# Patient Record
Sex: Female | Born: 1965 | Race: Black or African American | Hispanic: No | Marital: Single | Smoking: Never smoker
Health system: Southern US, Community
[De-identification: ages and names within clinical notes are randomized; demographics above are authoritative.]

## PROBLEM LIST (undated history)

## (undated) DIAGNOSIS — M1990 Unspecified osteoarthritis, unspecified site: Secondary | ICD-10-CM

## (undated) DIAGNOSIS — G4730 Sleep apnea, unspecified: Secondary | ICD-10-CM

## (undated) DIAGNOSIS — I10 Essential (primary) hypertension: Secondary | ICD-10-CM

## (undated) DIAGNOSIS — F419 Anxiety disorder, unspecified: Secondary | ICD-10-CM

## (undated) DIAGNOSIS — F32A Depression, unspecified: Secondary | ICD-10-CM

## (undated) DIAGNOSIS — K219 Gastro-esophageal reflux disease without esophagitis: Secondary | ICD-10-CM

## (undated) DIAGNOSIS — J40 Bronchitis, not specified as acute or chronic: Secondary | ICD-10-CM

## (undated) DIAGNOSIS — F329 Major depressive disorder, single episode, unspecified: Secondary | ICD-10-CM

## (undated) DIAGNOSIS — M47819 Spondylosis without myelopathy or radiculopathy, site unspecified: Secondary | ICD-10-CM

## (undated) DIAGNOSIS — J020 Streptococcal pharyngitis: Secondary | ICD-10-CM

## (undated) DIAGNOSIS — D649 Anemia, unspecified: Secondary | ICD-10-CM

## (undated) DIAGNOSIS — E785 Hyperlipidemia, unspecified: Secondary | ICD-10-CM

## (undated) HISTORY — PX: WISDOM TOOTH EXTRACTION: SHX21

## (undated) HISTORY — DX: Morbid (severe) obesity due to excess calories: E66.01

## (undated) HISTORY — DX: Hyperlipidemia, unspecified: E78.5

## (undated) HISTORY — DX: Sleep apnea, unspecified: G47.30

## (undated) HISTORY — PX: OTHER SURGICAL HISTORY: SHX169

---

## 1987-08-29 HISTORY — PX: TUBAL LIGATION: SHX77

## 1995-08-29 HISTORY — PX: BREAST SURGERY: SHX581

## 1995-08-29 HISTORY — PX: BREAST EXCISIONAL BIOPSY: SUR124

## 2001-08-16 ENCOUNTER — Inpatient Hospital Stay (HOSPITAL_COMMUNITY): Admission: AD | Admit: 2001-08-16 | Discharge: 2001-08-16 | Payer: Self-pay | Admitting: Obstetrics & Gynecology

## 2002-03-07 ENCOUNTER — Emergency Department (HOSPITAL_COMMUNITY): Admission: EM | Admit: 2002-03-07 | Discharge: 2002-03-07 | Payer: Self-pay | Admitting: Emergency Medicine

## 2002-03-07 ENCOUNTER — Encounter: Payer: Self-pay | Admitting: Emergency Medicine

## 2003-11-07 ENCOUNTER — Emergency Department (HOSPITAL_COMMUNITY): Admission: EM | Admit: 2003-11-07 | Discharge: 2003-11-07 | Payer: Self-pay | Admitting: Emergency Medicine

## 2003-11-09 ENCOUNTER — Emergency Department (HOSPITAL_COMMUNITY): Admission: EM | Admit: 2003-11-09 | Discharge: 2003-11-09 | Payer: Self-pay | Admitting: Family Medicine

## 2005-02-14 ENCOUNTER — Emergency Department (HOSPITAL_COMMUNITY): Admission: EM | Admit: 2005-02-14 | Discharge: 2005-02-14 | Payer: Self-pay | Admitting: Emergency Medicine

## 2006-12-10 ENCOUNTER — Ambulatory Visit: Payer: Self-pay | Admitting: Internal Medicine

## 2006-12-17 ENCOUNTER — Ambulatory Visit: Payer: Self-pay | Admitting: Internal Medicine

## 2007-01-07 ENCOUNTER — Ambulatory Visit: Payer: Self-pay | Admitting: Internal Medicine

## 2007-02-04 ENCOUNTER — Ambulatory Visit: Payer: Self-pay | Admitting: Internal Medicine

## 2007-07-18 ENCOUNTER — Ambulatory Visit: Payer: Self-pay | Admitting: Internal Medicine

## 2007-10-03 ENCOUNTER — Ambulatory Visit: Payer: Self-pay | Admitting: Internal Medicine

## 2007-10-03 ENCOUNTER — Encounter: Payer: Self-pay | Admitting: Internal Medicine

## 2007-10-03 LAB — CONVERTED CEMR LAB: GC Probe Amp, Genital: NEGATIVE

## 2007-11-28 ENCOUNTER — Ambulatory Visit: Payer: Self-pay | Admitting: Internal Medicine

## 2008-01-01 ENCOUNTER — Ambulatory Visit: Payer: Self-pay | Admitting: Internal Medicine

## 2008-02-11 ENCOUNTER — Emergency Department (HOSPITAL_COMMUNITY): Admission: EM | Admit: 2008-02-11 | Discharge: 2008-02-11 | Payer: Self-pay | Admitting: Emergency Medicine

## 2008-03-11 ENCOUNTER — Ambulatory Visit: Payer: Self-pay | Admitting: Internal Medicine

## 2008-03-11 LAB — CONVERTED CEMR LAB
CO2: 25 meq/L (ref 19–32)
Chloride: 99 meq/L (ref 96–112)
Potassium: 4 meq/L (ref 3.5–5.3)
Sodium: 138 meq/L (ref 135–145)

## 2009-01-12 ENCOUNTER — Ambulatory Visit: Payer: Self-pay | Admitting: Internal Medicine

## 2009-05-31 ENCOUNTER — Telehealth (INDEPENDENT_AMBULATORY_CARE_PROVIDER_SITE_OTHER): Payer: Self-pay | Admitting: *Deleted

## 2009-06-02 ENCOUNTER — Ambulatory Visit: Payer: Self-pay | Admitting: Internal Medicine

## 2009-08-16 ENCOUNTER — Ambulatory Visit: Payer: Self-pay | Admitting: Internal Medicine

## 2009-09-02 ENCOUNTER — Telehealth (INDEPENDENT_AMBULATORY_CARE_PROVIDER_SITE_OTHER): Payer: Self-pay | Admitting: *Deleted

## 2009-09-13 ENCOUNTER — Ambulatory Visit: Payer: Self-pay | Admitting: Internal Medicine

## 2010-03-11 ENCOUNTER — Ambulatory Visit: Payer: Self-pay | Admitting: Internal Medicine

## 2010-03-11 ENCOUNTER — Ambulatory Visit (HOSPITAL_COMMUNITY): Admission: RE | Admit: 2010-03-11 | Discharge: 2010-03-11 | Payer: Self-pay | Admitting: Internal Medicine

## 2010-03-11 LAB — CONVERTED CEMR LAB
BUN: 12 mg/dL (ref 6–23)
Chloride: 100 meq/L (ref 96–112)
Glucose, Bld: 91 mg/dL (ref 70–99)
Potassium: 3.9 meq/L (ref 3.5–5.3)
Sodium: 137 meq/L (ref 135–145)

## 2010-03-24 ENCOUNTER — Emergency Department (HOSPITAL_COMMUNITY): Admission: EM | Admit: 2010-03-24 | Discharge: 2010-03-24 | Payer: Self-pay | Admitting: Family Medicine

## 2010-03-28 ENCOUNTER — Encounter: Admission: RE | Admit: 2010-03-28 | Discharge: 2010-03-28 | Payer: Self-pay | Admitting: Internal Medicine

## 2010-04-01 ENCOUNTER — Ambulatory Visit: Payer: Self-pay | Admitting: Internal Medicine

## 2010-04-07 ENCOUNTER — Ambulatory Visit: Payer: Self-pay | Admitting: Internal Medicine

## 2010-08-01 ENCOUNTER — Encounter (INDEPENDENT_AMBULATORY_CARE_PROVIDER_SITE_OTHER): Payer: Self-pay | Admitting: Internal Medicine

## 2010-08-01 LAB — CONVERTED CEMR LAB
BUN: 10 mg/dL (ref 6–23)
Chloride: 99 meq/L (ref 96–112)
Glucose, Bld: 105 mg/dL — ABNORMAL HIGH (ref 70–99)
Potassium: 3.4 meq/L — ABNORMAL LOW (ref 3.5–5.3)
Sodium: 139 meq/L (ref 135–145)

## 2010-09-29 NOTE — Progress Notes (Signed)
Summary: triage/possible medication reaction  Phone Note Call from Patient   Caller: Patient Reason for Call: Talk to Nurse Summary of Call: patient states she is having tremors and dizziness with new medication "Mirtazapine"  (Remeron).  She was see by Dr. Reche Dixon and he discontinued Celexa (which is noted in chart on 12/20.Marland KitchenMarland KitchenShe states he also disconntinued Clonazapam 0.5mg .Marland KitchenMarland KitchenPatient started on Mirtazapine 15 mg 1 by mouth at hs for 7 days...now she is suppose to increase dose to 2 tablets at hs.Marland KitchenMarland KitchenPatients pharmacy called and she last picked up Clonazapam 12/15 and has 3 refills left.Marland KitchenMarland KitchenSpoke with Dr. Audria Nine and orders given.. Initial call taken by: Conchita Paris,  September 02, 2009 9:44 AM

## 2010-11-28 ENCOUNTER — Inpatient Hospital Stay (INDEPENDENT_AMBULATORY_CARE_PROVIDER_SITE_OTHER)
Admission: RE | Admit: 2010-11-28 | Discharge: 2010-11-28 | Disposition: A | Discharge status: Home / Self Care | Payer: Self-pay | Source: Ambulatory Visit | Attending: Family Medicine | Admitting: Family Medicine

## 2010-11-28 DIAGNOSIS — J020 Streptococcal pharyngitis: Secondary | ICD-10-CM

## 2010-11-28 LAB — POCT RAPID STREP A (OFFICE): Streptococcus, Group A Screen (Direct): NEGATIVE

## 2011-03-06 ENCOUNTER — Ambulatory Visit (HOSPITAL_BASED_OUTPATIENT_CLINIC_OR_DEPARTMENT_OTHER): Payer: Self-pay | Attending: Internal Medicine

## 2011-03-06 DIAGNOSIS — G4733 Obstructive sleep apnea (adult) (pediatric): Secondary | ICD-10-CM | POA: Insufficient documentation

## 2011-03-11 DIAGNOSIS — G4733 Obstructive sleep apnea (adult) (pediatric): Secondary | ICD-10-CM

## 2011-03-11 NOTE — Procedures (Signed)
NAME:  Claire Green, Claire Green                 ACCOUNT NO.:  [id number]  MEDICAL RECORD NO.:  [id number]          PATIENT TYPE:  OUT  LOCATION:  SLEEP CENTER                 FACILITY:  [facility]  PHYSICIAN:  Clinton D. Maple Hudson, MD, FCCP, FACPDATE OF BIRTH:  March 23, 1966  DATE OF STUDY:  03/06/2011                           NOCTURNAL POLYSOMNOGRAM  REFERRING PHYSICIAN:  Julieanne Manson  REFERRING PHYSICIAN:  Marcene Duos, MD  INDICATIONS FOR STUDY:  Hypersomnia with sleep apnea.  EPWORTH SLEEPINESS SCORE:  9/24, BMI 42.  Weight 250 pounds, height 65 inches, neck 15 inches.  Home medications are charted and reviewed.  SLEEP ARCHITECTURE:  Total sleep time 298.5 minutes with sleep efficiency 82.3%.  Stage I was 9%, stage II 83.4%, stage III 0.2%, REM 7.4% of total sleep time.  Sleep latency 38 minutes, REM latency 177.5 minutes, awake after sleep onset 27.5 minutes, arousal index 9.2.  BEDTIME MEDICATION:  None.  RESPIRATORY DATA:  Apnea/hypopnea index (AHI) 27.3 per hour.  A total of 136 events was scored including 38 obstructive apneas, 12 central apneas, 1 mixed apnea, 85 hypopneas.  Events were seen in all sleep positions, especially while supine.  REM AHI 16.4 per hour.  There were insufficient early events to meet necessary criteria for initiation of CPAP titration by split protocol on this study night.  OXYGEN DATA:  Moderately loud snoring with oxygen desaturation to a nadir of 84% and mean oxygen saturation through the study of 94.8% on room air.  CARDIAC DATA:  Normal sinus rhythm.  MOVEMENT/PARASOMNIA:  No significant movement disturbance.  No bathroom trips.  IMPRESSION/RECOMMENDATIONS: 1. Moderate obstructive sleep apnea/hypopnea syndrome, AHI 27.3 per     hour.  Nonpositional events with moderately loud snoring and oxygen     desaturation to a nadir of 84% with a mean saturation through the     study of 94.8% on room air. 2. There were insufficient early events to  meet diagnostic criteria     and time to allow initiation of CPAP titration     by split protocol on this study night.  Consider return for CPAP     titration study or evaluate for clinical management as indicated.     Clinton D. Maple Hudson, MD, [facility], FACP Diplomate, Biomedical engineer of Sleep Medicine Electronically Signed    CDY/MEDQ  D:  03/11/2011 09:25:42  T:  03/11/2011 12:15:17  Job:  [id number]

## 2011-05-02 ENCOUNTER — Ambulatory Visit (HOSPITAL_BASED_OUTPATIENT_CLINIC_OR_DEPARTMENT_OTHER): Payer: Self-pay | Attending: Family Medicine

## 2011-05-02 DIAGNOSIS — G4730 Sleep apnea, unspecified: Secondary | ICD-10-CM | POA: Insufficient documentation

## 2011-05-02 DIAGNOSIS — G4710 Hypersomnia, unspecified: Secondary | ICD-10-CM | POA: Insufficient documentation

## 2011-05-06 DIAGNOSIS — G4733 Obstructive sleep apnea (adult) (pediatric): Secondary | ICD-10-CM

## 2011-05-06 NOTE — Procedures (Signed)
NAME:  Claire Green, Claire Green                 ACCOUNT NO.:  [id number]  MEDICAL RECORD NO.:  [id number]          PATIENT TYPE:  OUT  LOCATION:  SLEEP CENTER                 [facility]ACILITY:  [facility]  PHYSICIAN:  Breydon Senters D. Maple Hudson, MD, FCCP, FACPDATE OF BIRTH:  05-21-66  DATE OF STUDY:  05/02/2011                           NOCTURNAL POLYSOMNOGRAM  REFERRING PHYSICIAN:  Georganna Skeans, MD  INDICATION FOR STUDY:  Hypersomnia with sleep apnea.  EPWORTH SLEEPINESS SCORE:  8/24.  BMI 41.6.  Weight 250 pounds, height 65 inches.  Neck 15 inches.  MEDICATIONS:  Charted and reviewed.  A baseline diagnostic and PSG on March 06, 2011 demonstrated moderate obstructive sleep apnea with an AHI of 27.3 per hour.  CPAP titration is now requested.  Bedtime Medication:  Clonazepam, Neurontin, hydroxyzine.  SLEEP ARCHITECTURE:  Total sleep time 366.5 minutes with sleep efficiency 86.2%.  Stage I was 4.4%, stage II 75.3%, stage III 1.1%, REM 19.2% of total sleep time.  Sleep latency 48.5 minutes, REM latency 143 minutes, awake after sleep onset 10 minutes, arousal index 13.6.  RESPIRATORY DATA:  CPAP titration protocol.  CPAP was titrated to 13 CWP, AHI 1.7 per hour.  She wore a small ResMed Mirage FX full-face mask with heated humidifier and a C-Flex setting of 3.  OXYGEN DATA:  Snoring was prevented and mean oxygen saturation held 97.5% on room air with CPAP.  CARDIAC DATA:  Normal sinus rhythm.  MOVEMENT-PARASOMNIA:  A few limb jerks were noted with insignificant effect on sleep.  No bathroom trips.  IMPRESSIONS-RECOMMENDATIONS: 1. Successful CPAP titration to 13 CWP, AHI 1.7 per hour.  She wore a     small ResMed Mirage FX full-face mask     with heated humidifier and C-Flex setting of 3.  Snoring was     prevented and oxygenation well maintained. 2. Baseline diagnostic NPSG on March 06, 2011, had demonstrated AHI of     27.3 per hour.     Kleber Crean D. Maple Hudson, MD, [facility], FACP Diplomate, Biomedical engineer of  Sleep Medicine Electronically Signed    CDY/MEDQ  D:  05/06/2011 10:51:19  T:  05/06/2011 11:33:07  Job:  [id number]

## 2011-08-09 ENCOUNTER — Other Ambulatory Visit (HOSPITAL_COMMUNITY): Payer: Self-pay | Admitting: Family Medicine

## 2011-08-09 DIAGNOSIS — Z1231 Encounter for screening mammogram for malignant neoplasm of breast: Secondary | ICD-10-CM

## 2011-08-13 ENCOUNTER — Emergency Department (HOSPITAL_COMMUNITY): Payer: Self-pay

## 2011-08-13 ENCOUNTER — Emergency Department (HOSPITAL_COMMUNITY)
Admission: EM | Admit: 2011-08-13 | Discharge: 2011-08-13 | Disposition: A | Discharge status: Home / Self Care | Payer: Self-pay | Attending: Emergency Medicine | Admitting: Emergency Medicine

## 2011-08-13 ENCOUNTER — Encounter: Payer: Self-pay | Admitting: *Deleted

## 2011-08-13 DIAGNOSIS — I10 Essential (primary) hypertension: Secondary | ICD-10-CM | POA: Insufficient documentation

## 2011-08-13 DIAGNOSIS — R1013 Epigastric pain: Secondary | ICD-10-CM | POA: Insufficient documentation

## 2011-08-13 DIAGNOSIS — E876 Hypokalemia: Secondary | ICD-10-CM | POA: Insufficient documentation

## 2011-08-13 DIAGNOSIS — R1011 Right upper quadrant pain: Secondary | ICD-10-CM | POA: Insufficient documentation

## 2011-08-13 DIAGNOSIS — R112 Nausea with vomiting, unspecified: Secondary | ICD-10-CM | POA: Insufficient documentation

## 2011-08-13 DIAGNOSIS — K219 Gastro-esophageal reflux disease without esophagitis: Secondary | ICD-10-CM | POA: Insufficient documentation

## 2011-08-13 HISTORY — DX: Gastro-esophageal reflux disease without esophagitis: K21.9

## 2011-08-13 HISTORY — DX: Unspecified osteoarthritis, unspecified site: M19.90

## 2011-08-13 HISTORY — DX: Essential (primary) hypertension: I10

## 2011-08-13 HISTORY — DX: Depression, unspecified: F32.A

## 2011-08-13 HISTORY — DX: Anxiety disorder, unspecified: F41.9

## 2011-08-13 HISTORY — DX: Major depressive disorder, single episode, unspecified: F32.9

## 2011-08-13 LAB — URINALYSIS, ROUTINE W REFLEX MICROSCOPIC
Glucose, UA: NEGATIVE mg/dL
Specific Gravity, Urine: 1.022 (ref 1.005–1.030)

## 2011-08-13 LAB — URINE MICROSCOPIC-ADD ON

## 2011-08-13 LAB — COMPREHENSIVE METABOLIC PANEL
ALT: 9 U/L (ref 0–35)
AST: 16 U/L (ref 0–37)
Alkaline Phosphatase: 72 U/L (ref 39–117)
CO2: 30 mEq/L (ref 19–32)
Calcium: 9.3 mg/dL (ref 8.4–10.5)
Potassium: 3.1 mEq/L — ABNORMAL LOW (ref 3.5–5.1)
Sodium: 138 mEq/L (ref 135–145)
Total Protein: 8.2 g/dL (ref 6.0–8.3)

## 2011-08-13 LAB — CBC
MCH: 21.5 pg — ABNORMAL LOW (ref 26.0–34.0)
MCHC: 30.8 g/dL (ref 30.0–36.0)
Platelets: 415 10*3/uL — ABNORMAL HIGH (ref 150–400)
RBC: 4.83 MIL/uL (ref 3.87–5.11)

## 2011-08-13 MED ORDER — MORPHINE SULFATE 4 MG/ML IJ SOLN
4.0000 mg | Freq: Once | INTRAMUSCULAR | Status: AC
  Administered 2011-08-13: 4 mg via INTRAVENOUS
  Filled 2011-08-13: qty 1

## 2011-08-13 MED ORDER — POTASSIUM CHLORIDE ER 10 MEQ PO TBCR: 20.0000 meq | EXTENDED_RELEASE_TABLET | Freq: Two times a day (BID) | ORAL | Status: DC

## 2011-08-13 MED ORDER — PROMETHAZINE HCL 25 MG PO TABS: 25.0000 mg | ORAL_TABLET | Freq: Four times a day (QID) | ORAL | Status: AC | PRN

## 2011-08-13 MED ORDER — POTASSIUM CHLORIDE CRYS ER 20 MEQ PO TBCR
40.0000 meq | EXTENDED_RELEASE_TABLET | Freq: Once | ORAL | Status: AC
  Administered 2011-08-13: 40 meq via ORAL
  Filled 2011-08-13: qty 1

## 2011-08-13 MED ORDER — SODIUM CHLORIDE 0.9 % IV SOLN
INTRAVENOUS | Status: DC
  Administered 2011-08-13: 12:00:00 via INTRAVENOUS

## 2011-08-13 MED ORDER — POTASSIUM CHLORIDE CRYS ER 20 MEQ PO TBCR
EXTENDED_RELEASE_TABLET | ORAL | Status: AC
  Filled 2011-08-13: qty 1

## 2011-08-13 MED ORDER — ONDANSETRON HCL 4 MG/2ML IJ SOLN
4.0000 mg | Freq: Once | INTRAMUSCULAR | Status: AC
  Administered 2011-08-13: 4 mg via INTRAVENOUS
  Filled 2011-08-13: qty 2

## 2011-08-13 MED ORDER — SODIUM CHLORIDE 0.9 % IV BOLUS (SEPSIS)
500.0000 mL | Freq: Once | INTRAVENOUS | Status: AC
  Administered 2011-08-13: 500 mL via INTRAVENOUS

## 2011-08-13 NOTE — ED Notes (Signed)
Pt reports having sudden onset of epigastric pain last night.  LBM was last night at 11pm-normal.  States that she was constipated and that last night was her first BM in 2 days.  Pt tender on palpation.  Pt reports vomiting x 2 this am.  States that she started to vomit after eating dinner.  Skin warm, dry and intact.  Neuro intact. Pt ambulatory in dept without difficulty.

## 2011-08-13 NOTE — ED Notes (Signed)
No active vomiting at the time. Pt resting quietly. Vital signs stable. Family remains at bedside.

## 2011-08-13 NOTE — ED Provider Notes (Signed)
History     CSN: [id number] Arrival date & time: 08/13/2011  6:40 AM   First MD Initiated Contact with Patient 08/13/11 [phone]      Chief Complaint  Patient presents with  . Abdominal Pain    BIL upper    (Consider location/radiation/quality/duration/timing/severity/associated sxs/prior treatment) Patient is a 45 y.o. female presenting with abdominal pain. The history is provided by the patient and the spouse.  Abdominal Pain The primary symptoms of the illness include abdominal pain, nausea and vomiting. The primary symptoms of the illness do not include fever, shortness of breath, diarrhea, hematemesis or dysuria. The current episode started yesterday. The onset of the illness was sudden. Progression since onset: Waxing and waning.  The abdominal pain is located in the RUQ and epigastric region. The abdominal pain does not radiate. The abdominal pain is relieved by nothing. The abdominal pain is exacerbated by vomiting.  Additional symptoms associated with the illness include chills. Symptoms associated with the illness do not include constipation, hematuria or back pain.   patient reports sudden onset of nausea last night that preceded upper abdominal pain, which was then followed by vomiting and resolution of the pain. The pain has been waxing and waning in this cycle since that time. It is epigastric and right upper quadrant in location, burning/stabbing in nature, moderate in intensity but absent at time of examination. There has been no prior treatment. Last bowel movement was yesterday and was normal for her. No sick contacts.  Past Medical History  Diagnosis Date  . Hypertension   . Arthritis   . Anxiety   . Depression   . GERD (gastroesophageal reflux disease)     History reviewed. No pertinent past surgical history.  History reviewed. No pertinent family history.  History  Substance Use Topics  . Smoking status: Not on file  . Smokeless tobacco: Not on file  . Alcohol  Use:      Review of Systems  Constitutional: Positive for chills. Negative for fever.  HENT: Negative for hearing loss, ear pain, congestion, sore throat, neck pain, neck stiffness and sinus pressure.   Eyes: Negative for pain and visual disturbance.  Respiratory: Negative for cough, chest tightness and shortness of breath.   Cardiovascular: Negative for chest pain, palpitations and leg swelling.  Gastrointestinal: Positive for nausea, vomiting and abdominal pain. Negative for diarrhea, constipation, blood in stool and hematemesis.  Genitourinary: Negative for dysuria and hematuria.       Currently on menses  Musculoskeletal: Negative for back pain, joint swelling and gait problem.  Skin: Negative for rash and wound.  Neurological: Negative for dizziness, syncope, weakness, numbness and headaches.  Psychiatric/Behavioral: Negative for behavioral problems and confusion.    Allergies  Penicillins  Home Medications   Current Outpatient Rx  Name Route Sig Dispense Refill  . CLONAZEPAM 0.5 MG PO TABS Oral Take 0.5 mg by mouth 3 (three) times daily as needed. anxiety     . GABAPENTIN 100 MG PO CAPS Oral Take 200 mg by mouth daily.      Marland Kitchen GABAPENTIN 400 MG PO CAPS Oral Take 400 mg by mouth at bedtime.      Marland Kitchen HYDROCHLOROTHIAZIDE 25 MG PO TABS Oral Take 25 mg by mouth daily.      Marland Kitchen HYDROXYZINE PAMOATE 25 MG PO CAPS Oral Take 25 mg by mouth 3 (three) times daily as needed.      Marland Kitchen NAPROXEN 375 MG PO TABS Oral Take 375 mg by mouth 2 (two) times  daily with a meal. Pain/ inflammation     . PANTOPRAZOLE SODIUM 40 MG PO TBEC Oral Take 40 mg by mouth daily.      . SERTRALINE HCL 100 MG PO TABS Oral Take 150 mg by mouth daily.        BP 131/68  Pulse 81  Temp(Src) 98.8 F (37.1 C) (Oral)  Resp 20  Ht 5\' 5"  (1.651 m)  Wt 266 lb (120.657 kg)  BMI 44.26 kg/m2  SpO2 99%  Physical Exam  Nursing note and vitals reviewed. Constitutional: She is oriented to person, place, and time. She appears  well-developed and well-nourished. No distress.       Obese  HENT:  Head: Normocephalic and atraumatic.  Right Ear: External ear normal.  Left Ear: External ear normal.  Mouth/Throat: Oropharynx is clear and moist.  Eyes: Conjunctivae are normal. Pupils are equal, round, and reactive to light. No scleral icterus.  Neck: Normal range of motion. Neck supple.  Cardiovascular: Normal rate, regular rhythm, normal heart sounds and intact distal pulses.   No murmur heard. Pulmonary/Chest: Effort normal and breath sounds normal. No respiratory distress. She exhibits no tenderness.  Abdominal: Soft. Bowel sounds are normal. She exhibits no distension and no mass. There is no rebound and no guarding.       Moderate tenderness to palpation over the right upper quadrant, mild tenderness to palpation in the epigastric region  Musculoskeletal: Normal range of motion. She exhibits no edema and no tenderness.  Lymphadenopathy:    She has no cervical adenopathy.  Neurological: She is alert and oriented to person, place, and time. Coordination normal.  Skin: Skin is warm and dry. No rash noted.  Psychiatric: Her behavior is normal.    ED Course  Procedures (including critical care time)  Labs Reviewed  CBC - Abnormal; Notable for the following:    WBC 11.8 (*)    Hemoglobin 10.4 (*)    HCT 33.8 (*)    MCV 70.0 (*)    MCH 21.5 (*)    RDW 17.5 (*)    Platelets 415 (*)    All other components within normal limits  COMPREHENSIVE METABOLIC PANEL - Abnormal; Notable for the following:    Potassium 3.1 (*)    Glucose, Bld 121 (*)    GFR calc non Af Amer 85 (*)    All other components within normal limits  URINALYSIS, ROUTINE W REFLEX MICROSCOPIC - Abnormal; Notable for the following:    APPearance CLOUDY (*)    Hgb urine dipstick LARGE (*)    Leukocytes, UA SMALL (*)    All other components within normal limits  URINE MICROSCOPIC-ADD ON - Abnormal; Notable for the following:    Squamous  Epithelial / LPF FEW (*)    All other components within normal limits  LIPASE, BLOOD   US Abdomen Complete  08/13/2011  *RADIOLOGY REPORT*  Clinical Data:  Right upper quadrant abdominal pain, nausea  COMPLETE ABDOMINAL ULTRASOUND  Comparison:  None.  Findings:  Gallbladder:  No gallstones, gallbladder wall thickening, or pericholecystic fluid.  Negative sonographic Murphy's sign  Common bile duct:  Normal in size, measuring 6 mm in diameter.  Liver:  Homogeneous hepatic echotexture.  No discrete hepatic lesions.  No ascites.  IVC:  Appears normal  Pancreas:  No focal abnormality seen.  Spleen:  Normal in size measuring 0.6 cm in length.  Right Kidney:  Normal cortical thickness, echogenicity and size, measuring 10.2 cm in length.  No focal renal  lesions.  No echogenic renal stones.  No urinary obstruction.  Left Kidney:  Normal cortical thickness, echogenicity and size, measuring 12.2 cm in length.  No focal renal lesions.  No echogenic renal stones.  No urinary obstruction.  Abdominal aorta:  No aneurysm identified.  IMPRESSION: Negative abdominal ultrasound.  Specifically, no evidence of cholelithiasis.  Original Report Authenticated By: Waynard Reeds, M.D.     Diagnosis#1 abdominal pain Diagnosis #2 hypokalemia   MDM  Abdominal pain with nausea and vomiting x1 day. Well-appearing on exam. Vital signs stable. Nausea but no vomiting since arrival to the emergency department. Abdominal ultrasound with no evidence of cholecystitis or cholelithiasis. Have discussed lab and ultrasound results with patient and she will be discharged home with nausea medication and instructions to followup with her regular doctor for a recheck or return to the emergency department if she develops worsening symptoms.  Medical screening examination/treatment/procedure(s) were performed by non-physician practitioner and as supervising physician I was immediately available for consultation/collaboration. Osvaldo Human, M.D.      [address] [place], [place] 08/13/11 1258  Carleene Cooper III, MD 08/13/11 2032

## 2011-08-13 NOTE — ED Notes (Signed)
Pt began experiencing bil upper quadrant pain.  Initially stated vomiting, but presently no emesis.  No diarrhea, fever.

## 2011-08-13 NOTE — ED Notes (Signed)
Pt w/acute onset epigastric pain this am (0330).  Denies fever or diarrhea, but c/o burning/stabbing pain accompanied by multiple episodes of emesis.  Per family, "She was shaking really bad.  It didn't look normal."  Abd non-tender to palpation.

## 2011-08-23 ENCOUNTER — Other Ambulatory Visit (HOSPITAL_COMMUNITY): Payer: Self-pay | Admitting: Internal Medicine

## 2011-08-23 ENCOUNTER — Ambulatory Visit (HOSPITAL_COMMUNITY)
Admission: RE | Admit: 2011-08-23 | Discharge: 2011-08-23 | Disposition: A | Discharge status: Home / Self Care | Payer: Self-pay | Source: Ambulatory Visit | Attending: Internal Medicine | Admitting: Internal Medicine

## 2011-08-23 DIAGNOSIS — R52 Pain, unspecified: Secondary | ICD-10-CM

## 2011-08-23 DIAGNOSIS — M25569 Pain in unspecified knee: Secondary | ICD-10-CM | POA: Insufficient documentation

## 2011-09-03 ENCOUNTER — Emergency Department (INDEPENDENT_AMBULATORY_CARE_PROVIDER_SITE_OTHER)
Admission: EM | Admit: 2011-09-03 | Discharge: 2011-09-03 | Disposition: A | Discharge status: Home / Self Care | Payer: Self-pay | Source: Home / Self Care | Attending: Emergency Medicine | Admitting: Emergency Medicine

## 2011-09-03 ENCOUNTER — Encounter (HOSPITAL_COMMUNITY): Payer: Self-pay | Admitting: Cardiology

## 2011-09-03 DIAGNOSIS — J020 Streptococcal pharyngitis: Secondary | ICD-10-CM

## 2011-09-03 HISTORY — DX: Sleep apnea, unspecified: G47.30

## 2011-09-03 HISTORY — DX: Streptococcal pharyngitis: J02.0

## 2011-09-03 LAB — POCT RAPID STREP A: Streptococcus, Group A Screen (Direct): POSITIVE — AB

## 2011-09-03 MED ORDER — IBUPROFEN 600 MG PO TABS: 600.0000 mg | ORAL_TABLET | Freq: Four times a day (QID) | ORAL | Status: DC | PRN

## 2011-09-03 MED ORDER — HYDROCODONE-ACETAMINOPHEN 5-325 MG PO TABS: 2.0000 | ORAL_TABLET | ORAL | Status: DC | PRN

## 2011-09-03 MED ORDER — AZITHROMYCIN 250 MG PO TABS: 250.0000 mg | ORAL_TABLET | Freq: Every day | ORAL | Status: AC

## 2011-09-03 NOTE — ED Provider Notes (Signed)
History     CSN: [id number]  Arrival date & time 09/03/11  4098   First MD Initiated Contact with Patient 09/03/11 1010      Chief Complaint  Patient presents with  . Sore Throat  . Generalized Body Aches  . Neck Pain  . Cough  . Fever     HPI Comments: SORE THROAT  Onset: 2 days    Severity: moderate Tried OTC meds without significant relief. Took aleve approx 3 hrs ago.   Symptoms:  + states has chills and feels "hot" but no documented fevers, + Swollen neck glands    + coarse Cough, no wheeze, SOB. + Myalgias + Headache No Rash, no rhinorrhea     No Recent Strep Exposure No Abdominal Pain No reflux sxs No Allergy sxs  No Breathing difficulty No Drooling No Trismus  Similar sx before when had strep last year.   Patient is a 46 y.o. female presenting with pharyngitis. The history is provided by the patient.  Sore Throat This is a new problem. The current episode started 2 days ago. The problem occurs constantly. Associated symptoms include headaches. Pertinent negatives include no chest pain, no abdominal pain and no shortness of breath. The symptoms are aggravated by swallowing. The symptoms are relieved by NSAIDs.    Past Medical History  Diagnosis Date  . Hypertension   . Arthritis   . Anxiety   . Depression   . GERD (gastroesophageal reflux disease)   . Apnea, sleep   . Strep throat     Past Surgical History  Procedure Date  . C section [id number], 1988, 1989  . Breast surgery 1997    cyst removed bilat breast  . Tubal ligation 1989    History reviewed. No pertinent family history.  History  Substance Use Topics  . Smoking status: Never Smoker   . Smokeless tobacco: Not on file  . Alcohol Use: No    OB History    Grav Para Term Preterm Abortions TAB SAB Ect Mult Living                  Review of Systems  Constitutional: Positive for fatigue.  HENT: Positive for sore throat. Negative for trouble swallowing and voice change.     Respiratory: Positive for cough. Negative for shortness of breath and wheezing.   Cardiovascular: Negative for chest pain.  Gastrointestinal: Negative for nausea, vomiting and abdominal pain.  Musculoskeletal: Positive for myalgias.  Skin: Negative for rash.  Neurological: Positive for headaches.    Allergies  Penicillins  Home Medications   Current Outpatient Rx  Name Route Sig Dispense Refill  . CLONAZEPAM 0.5 MG PO TABS Oral Take 0.5 mg by mouth 3 (three) times daily as needed. anxiety     . GABAPENTIN 100 MG PO CAPS Oral Take 200 mg by mouth daily.      Marland Kitchen GABAPENTIN 400 MG PO CAPS Oral Take 400 mg by mouth at bedtime.      Marland Kitchen HYDROCHLOROTHIAZIDE 25 MG PO TABS Oral Take 25 mg by mouth daily.      Marland Kitchen HYDROXYZINE PAMOATE 25 MG PO CAPS Oral Take 25 mg by mouth 3 (three) times daily as needed.      Marland Kitchen NAPROXEN 375 MG PO TABS Oral Take 375 mg by mouth 2 (two) times daily with a meal. Pain/ inflammation     . PANTOPRAZOLE SODIUM 40 MG PO TBEC Oral Take 40 mg by mouth daily.      Marland Kitchen  SERTRALINE HCL 100 MG PO TABS Oral Take 150 mg by mouth daily.      . AZITHROMYCIN 250 MG PO TABS Oral Take 1 tablet (250 mg total) by mouth daily. 2 tabs po on day 1, 1 tab po on days 2-5 6 tablet 0  . HYDROCODONE-ACETAMINOPHEN 5-325 MG PO TABS Oral Take 2 tablets by mouth every 4 (four) hours as needed for pain. 20 tablet 0  . IBUPROFEN 600 MG PO TABS Oral Take 1 tablet (600 mg total) by mouth every 6 (six) hours as needed for pain. 30 tablet 0    BP 117/83  Pulse 110  Temp(Src) 99.4 F (37.4 C) (Oral)  Resp 20  SpO2 95%  LMP 09/03/2011  Physical Exam  Constitutional: She appears well-developed. No distress.  HENT:  Right Ear: Tympanic membrane normal.  Left Ear: Tympanic membrane normal.  Nose: Nose normal.  Mouth/Throat: Uvula is midline and mucous membranes are normal. No tonsillar abscesses.       Bilateral swollen erythematous tonsils with exudates.  Eyes: Conjunctivae and EOM are normal.  Pupils are equal, round, and reactive to light.  Neck: Normal range of motion.  Cardiovascular: Normal heart sounds.   Pulmonary/Chest: Effort normal and breath sounds normal. She has no wheezes.  Abdominal: Soft. Bowel sounds are normal. There is no splenomegaly. There is no tenderness.  Lymphadenopathy:    She has cervical adenopathy.    ED Course  Procedures (including critical care time)  Labs Reviewed  POCT RAPID STREP A ([facility] ONLY) - Abnormal; Notable for the following:    Streptococcus, Group A Screen (Direct) POSITIVE (*)    All other components within normal limits   No results found.   1. Strep pharyngitis       MDM  Previous chart, labs, imaging reviewed.  Seen 12.16 with RUQ pain u/s, labs WNL except for mild hypokalemia at 3.1  Luiz Blare, MD 09/03/11 1132

## 2011-09-03 NOTE — ED Notes (Signed)
Pt reports sore throat, bilateral neck pain , coarse productive cough, generalized body aches since this past Friday. Reports fever this past Fri and Sat.  Tol PO liquids. Has taken Aleve with some relief of throat pain.

## 2011-09-06 ENCOUNTER — Ambulatory Visit: Payer: Self-pay | Admitting: Physical Therapy

## 2011-09-07 ENCOUNTER — Telehealth (HOSPITAL_COMMUNITY): Payer: Self-pay | Admitting: *Deleted

## 2011-09-08 ENCOUNTER — Emergency Department (INDEPENDENT_AMBULATORY_CARE_PROVIDER_SITE_OTHER)
Admission: EM | Admit: 2011-09-08 | Discharge: 2011-09-08 | Disposition: A | Discharge status: Home / Self Care | Payer: Self-pay | Source: Home / Self Care | Attending: Family Medicine | Admitting: Family Medicine

## 2011-09-08 ENCOUNTER — Encounter (HOSPITAL_COMMUNITY): Payer: Self-pay

## 2011-09-08 DIAGNOSIS — J020 Streptococcal pharyngitis: Secondary | ICD-10-CM

## 2011-09-08 MED ORDER — GUAIFENESIN-CODEINE 100-10 MG/5ML PO SYRP: 5.0000 mL | ORAL_SOLUTION | Freq: Four times a day (QID) | ORAL | Status: AC | PRN

## 2011-09-08 MED ORDER — LIDOCAINE VISCOUS 2 % MT SOLN: OROMUCOSAL | Status: DC

## 2011-09-08 MED ORDER — PREDNISONE (PAK) 10 MG PO TABS: ORAL_TABLET | ORAL | Status: AC

## 2011-09-08 NOTE — ED Provider Notes (Signed)
History     CSN: [id number]  Arrival date & time 09/08/11  1520   First MD Initiated Contact with Patient 09/08/11 1551      Chief Complaint  Patient presents with  . Sore Throat    (Consider location/radiation/quality/duration/timing/severity/associated sxs/prior treatment) HPI Comments: Claire Green presents for evaluation of persistent sore throat and difficulty eating. She was seen here on 09/03/11 for sore throat, given azithromycin and hydrocodone. She reports completion of the antibiotics and hallucinations with the hydrocodone. She has only been drinking liquids and eating light foods.   Patient is a 46 y.o. female presenting with pharyngitis.  Sore Throat This is a new problem. The current episode started more than 2 days ago. The problem occurs constantly. The problem has not changed since onset.The symptoms are aggravated by swallowing, eating and drinking. The symptoms are relieved by nothing.    Past Medical History  Diagnosis Date  . Hypertension   . Arthritis   . Anxiety   . Depression   . GERD (gastroesophageal reflux disease)   . Apnea, sleep   . Strep throat     Past Surgical History  Procedure Date  . C section A452551, 1988, 1989  . Breast surgery 1997    cyst removed bilat breast  . Tubal ligation 1989    History reviewed. No pertinent family history.  History  Substance Use Topics  . Smoking status: Never Smoker   . Smokeless tobacco: Not on file  . Alcohol Use: No    OB History    Grav Para Term Preterm Abortions TAB SAB Ect Mult Living                  Review of Systems  Constitutional: Negative.   HENT: Positive for sore throat and trouble swallowing.   Eyes: Negative.   Respiratory: Negative.   Cardiovascular: Negative.   Gastrointestinal: Negative.   Genitourinary: Negative.   Musculoskeletal: Negative.   Skin: Negative.   Neurological: Negative.     Allergies  Penicillins  Home Medications   Current Outpatient Rx  Name Route  Sig Dispense Refill  . AZITHROMYCIN 250 MG PO TABS Oral Take 1 tablet (250 mg total) by mouth daily. 2 tabs po on day 1, 1 tab po on days 2-5 6 tablet 0  . CLONAZEPAM 0.5 MG PO TABS Oral Take 0.5 mg by mouth 3 (three) times daily as needed. anxiety     . GABAPENTIN 100 MG PO CAPS Oral Take 200 mg by mouth daily.      Marland Kitchen GABAPENTIN 400 MG PO CAPS Oral Take 400 mg by mouth at bedtime.      . GUAIFENESIN-CODEINE 100-10 MG/5ML PO SYRP Oral Take 5 mLs by mouth every 6 (six) hours as needed for cough or congestion. 120 mL 0  . HYDROCHLOROTHIAZIDE 25 MG PO TABS Oral Take 25 mg by mouth daily.      Marland Kitchen HYDROXYZINE PAMOATE 25 MG PO CAPS Oral Take 25 mg by mouth 3 (three) times daily as needed.      Marland Kitchen LIDOCAINE VISCOUS 2 % MT SOLN  Gargle with 10 ml as often as every 3 hours for pain 100 mL 0  . NAPROXEN 375 MG PO TABS Oral Take 375 mg by mouth 2 (two) times daily with a meal. Pain/ inflammation     . PANTOPRAZOLE SODIUM 40 MG PO TBEC Oral Take 40 mg by mouth daily.      Marland Kitchen PREDNISONE (PAK) 10 MG PO TABS  Take 6  tablets on day 1, 5 tablets on day 2, 4 tablets on day 3, 3 tablets on day 4, 2 tablets on day 5, 1 tablet on day 6 21 tablet 0  . SERTRALINE HCL 100 MG PO TABS Oral Take 150 mg by mouth daily.        BP 125/65  Pulse 101  Temp(Src) 98.3 F (36.8 C) (Oral)  Resp 20  SpO2 96%  LMP 09/03/2011  Physical Exam  Nursing note and vitals reviewed. Constitutional: She is oriented to person, place, and time. She appears well-developed and well-nourished.  HENT:  Head: Normocephalic and atraumatic.  Mouth/Throat: Uvula is midline and mucous membranes are normal. Oropharyngeal exudate, posterior oropharyngeal edema and posterior oropharyngeal erythema present. No tonsillar abscesses.  Eyes: EOM are normal.  Neck: Normal range of motion.  Pulmonary/Chest: Effort normal.  Musculoskeletal: Normal range of motion.  Neurological: She is alert and oriented to person, place, and time.  Skin: Skin is warm  and dry.  Psychiatric: Her behavior is normal.    ED Course  Procedures (including critical care time)  Labs Reviewed - No data to display No results found.   1. Pharyngitis, streptococcal       MDM  Completed course of antibiotics; hydrocodone caused hallucinations; given rx for steroids, guaifenesin AC, and viscous lidocaine; return if no improvement in 48 hours        Richardo Priest, MD 09/08/11 1752

## 2011-09-08 NOTE — ED Notes (Signed)
C/o still having problems w ST, c/o has not gotten any relief w her Rx, c/o unable to eat anything but soup

## 2011-09-13 ENCOUNTER — Ambulatory Visit (HOSPITAL_COMMUNITY): Payer: Self-pay | Attending: Family Medicine

## 2011-11-18 ENCOUNTER — Encounter (HOSPITAL_COMMUNITY): Payer: Self-pay | Admitting: Emergency Medicine

## 2011-11-18 ENCOUNTER — Emergency Department (INDEPENDENT_AMBULATORY_CARE_PROVIDER_SITE_OTHER)
Admission: EM | Admit: 2011-11-18 | Discharge: 2011-11-18 | Disposition: A | Discharge status: Home / Self Care | Payer: Self-pay | Source: Home / Self Care | Attending: Emergency Medicine | Admitting: Emergency Medicine

## 2011-11-18 ENCOUNTER — Other Ambulatory Visit: Payer: Self-pay

## 2011-11-18 DIAGNOSIS — J45901 Unspecified asthma with (acute) exacerbation: Secondary | ICD-10-CM

## 2011-11-18 HISTORY — DX: Spondylosis without myelopathy or radiculopathy, site unspecified: M47.819

## 2011-11-18 HISTORY — DX: Bronchitis, not specified as acute or chronic: J40

## 2011-11-18 MED ORDER — ALBUTEROL SULFATE HFA 108 (90 BASE) MCG/ACT IN AERS
2.0000 | INHALATION_SPRAY | Freq: Once | RESPIRATORY_TRACT | Status: AC
  Administered 2011-11-18: 2 via RESPIRATORY_TRACT

## 2011-11-18 MED ORDER — ALBUTEROL SULFATE HFA 108 (90 BASE) MCG/ACT IN AERS: 1.0000 | INHALATION_SPRAY | Freq: Four times a day (QID) | RESPIRATORY_TRACT | Status: AC | PRN

## 2011-11-18 MED ORDER — ALBUTEROL SULFATE (5 MG/ML) 0.5% IN NEBU
INHALATION_SOLUTION | RESPIRATORY_TRACT | Status: AC
  Filled 2011-11-18: qty 1

## 2011-11-18 MED ORDER — ALBUTEROL SULFATE (5 MG/ML) 0.5% IN NEBU
5.0000 mg | INHALATION_SOLUTION | Freq: Once | RESPIRATORY_TRACT | Status: AC
  Administered 2011-11-18: 5 mg via RESPIRATORY_TRACT

## 2011-11-18 MED ORDER — GUAIFENESIN-CODEINE 100-10 MG/5ML PO SYRP: 5.0000 mL | ORAL_SOLUTION | Freq: Four times a day (QID) | ORAL | Status: AC | PRN

## 2011-11-18 MED ORDER — DEXAMETHASONE 4 MG PO TABS: ORAL_TABLET | ORAL | Status: AC

## 2011-11-18 MED ORDER — ALBUTEROL SULFATE HFA 108 (90 BASE) MCG/ACT IN AERS
INHALATION_SPRAY | RESPIRATORY_TRACT | Status: AC
  Filled 2011-11-18: qty 6.7

## 2011-11-18 MED ORDER — IPRATROPIUM BROMIDE 0.02 % IN SOLN
0.5000 mg | Freq: Once | RESPIRATORY_TRACT | Status: AC
  Administered 2011-11-18: 0.5 mg via RESPIRATORY_TRACT

## 2011-11-18 NOTE — Discharge Instructions (Signed)
Take two puffs from your inhaler every 4 hours. Finish the steroids unless your doctor tells you to stop. You may take tylenol gram up to 4 times a day as needed for pain. This with the motrin/naprosyn is an effective combination for pain and fever. Make sure you drink extra fluids. Return if you get worse, have a fever >100.4, or any other concerns.   Go to www.goodrx.com to look up your medications. This will give you a list of where you can find your prescriptions at the most affordable prices.

## 2011-11-18 NOTE — ED Notes (Signed)
PT HERE WITH WHEEZING,SOB AND COUGH SX THAT HAS RESOLVED.SX STARTED X 4 DYS AGO NOW C/O INTERMITT CHEST HEAVINESS WITH SHARP RADIATING PAIN TO LEFT UPPER BACK AND TINGLING SENSATION L ARM.NO CARDIAC HX.SATS 98% R/A

## 2011-11-18 NOTE — ED Provider Notes (Signed)
History     CSN: [id number]  Arrival date & time 11/18/11  1509   First MD Initiated Contact with Patient 11/18/11 1529      Chief Complaint  Patient presents with  . Bronchitis  . Chest Pain    HPI Comments: Patient reports wheezing, chest tightness, shortness of breath with exertion for 4 days. Reports nonproductive cough, unable to night secondary to coughing. Reports chest tightness and shortness of breath when walking around, especially in the cold air. Reports sternal, sharp chest pain after a coughing spell. Patient has never had this before when she has bronchitis. Patient reports some nasal congestion, but no rhinorrhea, itchy, watery eyes, sore throat, nausea, vomiting, diaphoresis abdominal pain. Patient has a history of bronchitis/asthma, and states this feels similar to that. Does not have  albuterol at home. No recent steroid use. PMH also includes reflux and OSA, on CPAP.  ROS as noted in HPI. All other ROS negative.   Patient is a 46 y.o. female presenting with wheezing. The history is provided by the patient. No language interpreter was used.  Wheezing  The current episode started 3 to 5 days ago. The onset was gradual. The problem has been gradually worsening. The symptoms are relieved by nothing. The symptoms are aggravated by activity. Associated symptoms include chest pain, rhinorrhea, cough, shortness of breath and wheezing. Pertinent negatives include no chest pressure, no orthopnea, no fever, no sore throat and no stridor. She has not inhaled smoke recently. She has had no prior steroid use. Her past medical history is significant for asthma.    Past Medical History  Diagnosis Date  . Hypertension   . Arthritis   . Anxiety   . Depression   . GERD (gastroesophageal reflux disease)   . Apnea, sleep   . Strep throat   . Asthma   . Bronchitis   . Arthritis, low back     Past Surgical History  Procedure Date  . C section [id number], 1988, 1989  . Breast surgery  1997    cyst removed bilat breast  . Tubal ligation 1989    History reviewed. No pertinent family history.  History  Substance Use Topics  . Smoking status: Never Smoker   . Smokeless tobacco: Not on file  . Alcohol Use: No    OB History    Grav Para Term Preterm Abortions TAB SAB Ect Mult Living                  Review of Systems  Constitutional: Negative for fever.  HENT: Positive for rhinorrhea. Negative for sore throat.   Respiratory: Positive for cough, shortness of breath and wheezing. Negative for stridor.   Cardiovascular: Positive for chest pain. Negative for orthopnea.    Allergies  Penicillins  Home Medications   Current Outpatient Rx  Name Route Sig Dispense Refill  . ALBUTEROL SULFATE HFA 108 (90 BASE) MCG/ACT IN AERS Inhalation Inhale 1-2 puffs into the lungs every 6 (six) hours as needed for wheezing. 1 Inhaler 0  . CLONAZEPAM 0.5 MG PO TABS Oral Take 0.5 mg by mouth 3 (three) times daily as needed. anxiety     . DEXAMETHASONE 4 MG PO TABS  4 tabs po at once on day one, 4 tabs po at once on day 2 8 tablet 0  . GABAPENTIN 100 MG PO CAPS Oral Take 200 mg by mouth daily.      Marland Kitchen GABAPENTIN 400 MG PO CAPS Oral Take 400 mg by mouth  at bedtime.      . GUAIFENESIN-CODEINE 100-10 MG/5ML PO SYRP Oral Take 5 mLs by mouth 4 (four) times daily as needed for cough. 120 mL 0  . HYDROCHLOROTHIAZIDE 25 MG PO TABS Oral Take 25 mg by mouth daily.      Marland Kitchen HYDROXYZINE PAMOATE 25 MG PO CAPS Oral Take 25 mg by mouth 3 (three) times daily as needed.      Marland Kitchen LIDOCAINE VISCOUS 2 % MT SOLN  Gargle with 10 ml as often as every 3 hours for pain 100 mL 0  . NAPROXEN 375 MG PO TABS Oral Take 375 mg by mouth 2 (two) times daily with a meal. Pain/ inflammation     . PANTOPRAZOLE SODIUM 40 MG PO TBEC Oral Take 40 mg by mouth daily.      . SERTRALINE HCL 100 MG PO TABS Oral Take 150 mg by mouth daily.        BP 125/76  Pulse 78  Temp(Src) 98.7 F (37.1 C) (Oral)  Resp 17  SpO2 98%   LMP 11/10/2011  Physical Exam  Nursing note and vitals reviewed. Constitutional: She is oriented to person, place, and time. She appears well-developed and well-nourished.  HENT:  Head: Normocephalic and atraumatic.  Nose: Rhinorrhea present. No mucosal edema. No epistaxis.  Mouth/Throat: Uvula is midline, oropharynx is clear and moist and mucous membranes are normal.       (-) frontal, maxillary sinus tenderness  Eyes: Conjunctivae and EOM are normal.  Neck: Normal range of motion. Neck supple.  Cardiovascular: Normal rate, regular rhythm, normal heart sounds and intact distal pulses.   Pulmonary/Chest: Effort normal. No respiratory distress. She has no wheezes. She has no rales. She exhibits no tenderness.       Diminished Air movement bilaterally  Abdominal: Soft. Bowel sounds are normal. She exhibits no distension.  Musculoskeletal: Normal range of motion.  Lymphadenopathy:    She has no cervical adenopathy.  Neurological: She is alert and oriented to person, place, and time.  Skin: Skin is warm and dry.  Psychiatric: She has a normal mood and affect. Her behavior is normal. Judgment and thought content normal.    ED Course  Procedures (including critical care time)  Labs Reviewed - No data to display No results found.   1. Asthma exacerbation     MDM   EKG: Normal sinus rhythm, rate 67. Normal axis, normal intervals. No hypertrophy. No ST-T wave changes. No previous EKG for comparison.  Suspect patient's symptoms are  from reactive airways/mild asthma exacerbation. Chest pain appears to be muscle skeletal etiology, as is present only after coughing, however, will check EKG as patient has  has several cardiac risk factors. If this is normal, will give her a DuoNeb and reevaluate. Lungs are clear, no history of fevers. Deferring x-ray.  On reevaluation, patient states she feels much better. Improved air movement, no wheezing on repeat physical exam. Treat this as if this is  a mild asthma exacerbation with short course of steroids and inhaled bronchodilators. Discussed MDM and plan with patient, who agrees.  Luiz Blare, MD 11/18/11 2129

## 2011-11-27 ENCOUNTER — Emergency Department (INDEPENDENT_AMBULATORY_CARE_PROVIDER_SITE_OTHER): Payer: Self-pay

## 2011-11-27 ENCOUNTER — Encounter (HOSPITAL_COMMUNITY): Payer: Self-pay | Admitting: *Deleted

## 2011-11-27 ENCOUNTER — Emergency Department (INDEPENDENT_AMBULATORY_CARE_PROVIDER_SITE_OTHER)
Admission: EM | Admit: 2011-11-27 | Discharge: 2011-11-27 | Disposition: A | Discharge status: Home / Self Care | Payer: Self-pay | Source: Home / Self Care | Attending: Family Medicine | Admitting: Family Medicine

## 2011-11-27 ENCOUNTER — Telehealth (HOSPITAL_COMMUNITY): Payer: Self-pay | Admitting: *Deleted

## 2011-11-27 DIAGNOSIS — J209 Acute bronchitis, unspecified: Secondary | ICD-10-CM

## 2011-11-27 DIAGNOSIS — J309 Allergic rhinitis, unspecified: Secondary | ICD-10-CM

## 2011-11-27 DIAGNOSIS — J45909 Unspecified asthma, uncomplicated: Secondary | ICD-10-CM

## 2011-11-27 MED ORDER — PREDNISONE (PAK) 10 MG PO TABS: 10.0000 mg | ORAL_TABLET | Freq: Every day | ORAL | Status: AC

## 2011-11-27 MED ORDER — HYDROCODONE-ACETAMINOPHEN 7.5-500 MG/15ML PO SOLN: 10.0000 mL | Freq: Two times a day (BID) | ORAL | Status: AC | PRN

## 2011-11-27 MED ORDER — CETIRIZINE HCL 10 MG PO CAPS: 1.0000 | ORAL_CAPSULE | Freq: Every evening | ORAL | Status: DC | PRN

## 2011-11-27 MED ORDER — BENZONATATE 100 MG PO CAPS: 100.0000 mg | ORAL_CAPSULE | Freq: Three times a day (TID) | ORAL | Status: AC

## 2011-11-27 MED ORDER — OMEPRAZOLE 20 MG PO CPDR: 20.0000 mg | DELAYED_RELEASE_CAPSULE | Freq: Every day | ORAL | Status: DC

## 2011-11-27 NOTE — ED Provider Notes (Signed)
History     CSN: [id number]  Arrival date & time 11/27/11  0909   First MD Initiated Contact with Patient 11/27/11 [phone]      Chief Complaint  Patient presents with  . Wheezing  . Chest Pain    (Consider location/radiation/quality/duration/timing/severity/associated sxs/prior treatment) HPI Comments: 46 year old female nonsmoker with history of obesity, asthma, GERD and bronchitis among other comorbidities here complaining of persistent cough and wheezing for almost 2 weeks now. Denies fever, chills, malaise, headache, nausea or vomiting. Was seen here last week by Dr. Terrilee Croak was treated with 2 days of dexamethasone for 2 days and a albuterol inhaler. State her symptoms have remained the same. Clear sputum, use her albuterol 3-4 times yesterday, has not used the inhaler today. Reports clear rhinorrhea, nasal congestion and postnasal drip.   Past Medical History  Diagnosis Date  . Hypertension   . Arthritis   . Anxiety   . Depression   . GERD (gastroesophageal reflux disease)   . Apnea, sleep   . Strep throat   . Asthma   . Bronchitis   . Arthritis, low back     Past Surgical History  Procedure Date  . C section A452551, 1988, 1989  . Breast surgery 1997    cyst removed bilat breast  . Tubal ligation 1989    No family history on file.  History  Substance Use Topics  . Smoking status: Never Smoker   . Smokeless tobacco: Not on file  . Alcohol Use: No    OB History    Grav Para Term Preterm Abortions TAB SAB Ect Mult Living                  Review of Systems  Constitutional: Negative for fever, chills, diaphoresis, appetite change and fatigue.  HENT: Positive for congestion, rhinorrhea, sneezing and postnasal drip. Negative for sore throat, trouble swallowing and neck stiffness.   Eyes: Positive for itching.  Respiratory: Positive for cough, shortness of breath and wheezing.   Cardiovascular: Positive for chest pain. Negative for leg swelling.    Gastrointestinal: Negative for nausea, vomiting, abdominal pain and diarrhea.  Skin: Negative for rash.    Allergies  Penicillins  Home Medications   Current Outpatient Rx  Name Route Sig Dispense Refill  . ALBUTEROL SULFATE HFA 108 (90 BASE) MCG/ACT IN AERS Inhalation Inhale 1-2 puffs into the lungs every 6 (six) hours as needed for wheezing. 1 Inhaler 0  . CLONAZEPAM 0.5 MG PO TABS Oral Take 0.5 mg by mouth 3 (three) times daily as needed. anxiety     . DEXAMETHASONE 4 MG PO TABS  4 tabs po at once on day one, 4 tabs po at once on day 2 8 tablet 0  . GABAPENTIN 100 MG PO CAPS Oral Take 200 mg by mouth daily.      Marland Kitchen GABAPENTIN 400 MG PO CAPS Oral Take 400 mg by mouth at bedtime.      . GUAIFENESIN-CODEINE 100-10 MG/5ML PO SYRP Oral Take 5 mLs by mouth 4 (four) times daily as needed for cough. 120 mL 0  . HYDROCHLOROTHIAZIDE 25 MG PO TABS Oral Take 25 mg by mouth daily.      Marland Kitchen HYDROXYZINE PAMOATE 25 MG PO CAPS Oral Take 25 mg by mouth 3 (three) times daily as needed.      Marland Kitchen LIDOCAINE VISCOUS 2 % MT SOLN  Gargle with 10 ml as often as every 3 hours for pain 100 mL 0  . NAPROXEN 375  MG PO TABS Oral Take 375 mg by mouth 2 (two) times daily with a meal. Pain/ inflammation     . PANTOPRAZOLE SODIUM 40 MG PO TBEC Oral Take 40 mg by mouth daily.      . SERTRALINE HCL 100 MG PO TABS Oral Take 150 mg by mouth daily.      Marland Kitchen BENZONATATE 100 MG PO CAPS Oral Take 1 capsule (100 mg total) by mouth every 8 (eight) hours. 21 capsule 0  . CETIRIZINE HCL 10 MG PO CAPS Oral Take 1 capsule (10 mg total) by mouth at bedtime as needed. 30 capsule 0  . HYDROCODONE-ACETAMINOPHEN 7.5-500 MG/15ML PO SOLN Oral Take 10 mLs by mouth 2 (two) times daily as needed for cough. 50 mL 0  . OMEPRAZOLE 20 MG PO CPDR Oral Take 1 capsule (20 mg total) by mouth daily. 30 capsule 0  . PREDNISONE (PAK) 10 MG PO TABS Oral Take 1 tablet (10 mg total) by mouth daily. Take  4tabs po on day #1 then           3 tabs po on day#2           2 tbas po on day#3          1 tab po on  day#4        1/2 tab po on day#5 10 tablet 0    BP 131/86  Pulse 82  Temp(Src) 98.7 F (37.1 C) (Oral)  Resp 18  SpO2 95%  LMP 11/10/2011  Physical Exam  Nursing note and vitals reviewed. Constitutional: She is oriented to person, place, and time. She appears well-developed and well-nourished. No distress.  HENT:  Head: Normocephalic and atraumatic.       Nasal Congestion with erythema and swelling of nasal turbinates, clear rhinorrhea. no pharyngeal erythema no exudates. No uvula deviation. No trismus. Postnasal drip. TM's normal   Eyes: Conjunctivae are normal.  Neck: No JVD present.  Cardiovascular: Normal rate, regular rhythm and normal heart sounds.  Exam reveals no gallop and no friction rub.   Pulmonary/Chest: No respiratory distress. She has no wheezes. She has no rales.       Mild scattered bilateral rhonchi. No tachypnea, no retraction or orthopnea. No active wheezing.   Lymphadenopathy:    She has no cervical adenopathy.  Neurological: She is alert and oriented to person, place, and time.  Skin: No rash noted. She is not diaphoretic.    ED Course  Procedures (including critical care time)  Labs Reviewed - No data to display No results found.   1. Bronchitis with asthma, subacute   2. Allergic rhinitis       MDM  Increased bronchial marking on X-rays. No consolidations or infiltrates. Impress asthma flare up likely related to seasonal allergies. GERD could be contributing. Asked to continue albuterol inhaler. Extended steroid therapy with prednisone taper, cetirizine, tessalon perls, prilosec and hydrocodone. Asked to follow up with primary care provider if persistent symptoms as she might require long term inhaled steroid to r/o chronic conditions like sarcoidosis or require lung functional test.        Sharin Grave, MD 11/29/11 1128

## 2011-11-27 NOTE — Discharge Instructions (Signed)
Your lung x-rays do not show signs of infection or pneumonia. My impression is that you have cough and symptoms are related to bronchitis and seasonal allergies. Take the prescribed medications as instructed. Use nasal saline spray over-the-counter at least 3 times a day. Be aware that hydrocodone can make you drowsy and she should not drive after taking. Is better to take when you are at home. Followup with your primary care provider in 2-5 days it may be reasonable to keep you on nasal and inhaler steroids if persistent symptoms you may also benefit from a referral for long functional tests. Conditions like sarcoidosis and other chronic lung disease need to be ruled out by your primary care provider if persistent symptoms. Go to the emergency department if difficulty breathing or chest pain.

## 2011-11-27 NOTE — ED Notes (Signed)
Pharmacy called on VM @ 1226 and said sig. is short 1/2 tablet on the Prednisone. Discussed with Dr. Tressia Danas and she said to add one tablet ( # 11 total) and tell pt. to break in half and only take 1/2 on day 5. I called [facility] pharmacist and gave him this information.  Claire Green 11/27/2011

## 2011-11-27 NOTE — ED Notes (Signed)
Pt seen here on Friday for URI and given breathing treatment, inhaler, steroids and cough meds.  States she is not any better.  Now with pain in upper back/chest.  States that wheezing is worse.  Very congested.  Lung sounds congested.

## 2012-10-16 DIAGNOSIS — F428 Other obsessive-compulsive disorder: Secondary | ICD-10-CM | POA: Insufficient documentation

## 2012-12-21 IMAGING — CR DG KNEE COMPLETE 4+V*R*
4 series · 4 of 4 positions shown · non-contrast
Comparison: None.

CLINICAL DATA: 2 years popping sensation now with painful sensation
anterior knee.

RIGHT KNEE - COMPLETE 4+ VIEW

[t knee ap right *]
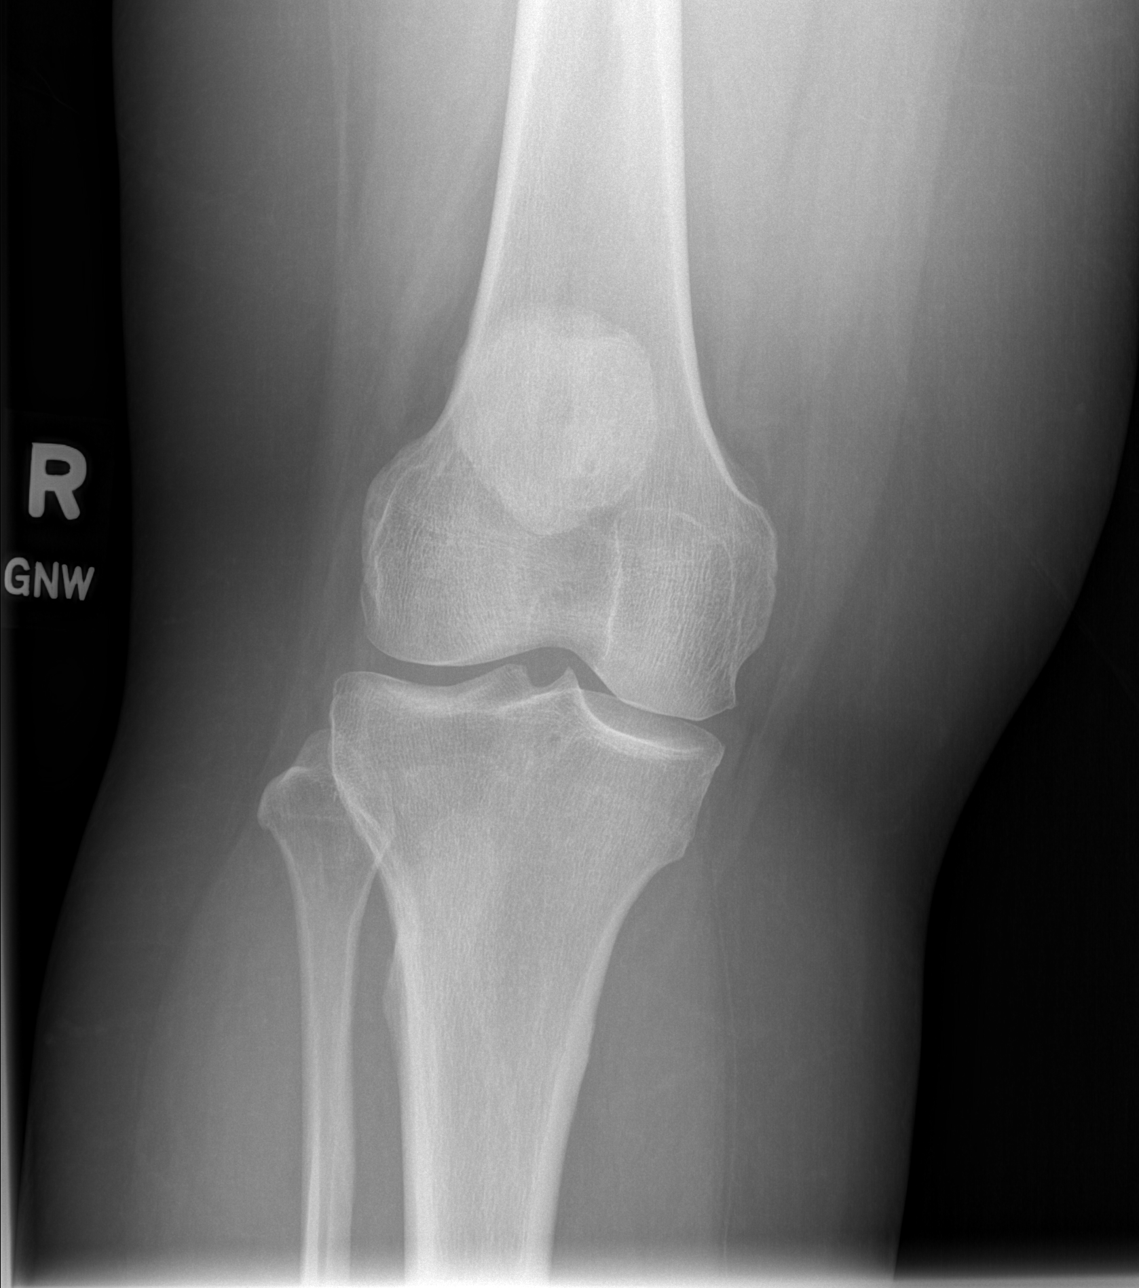

[t knee oblique right * (1 of 2)]
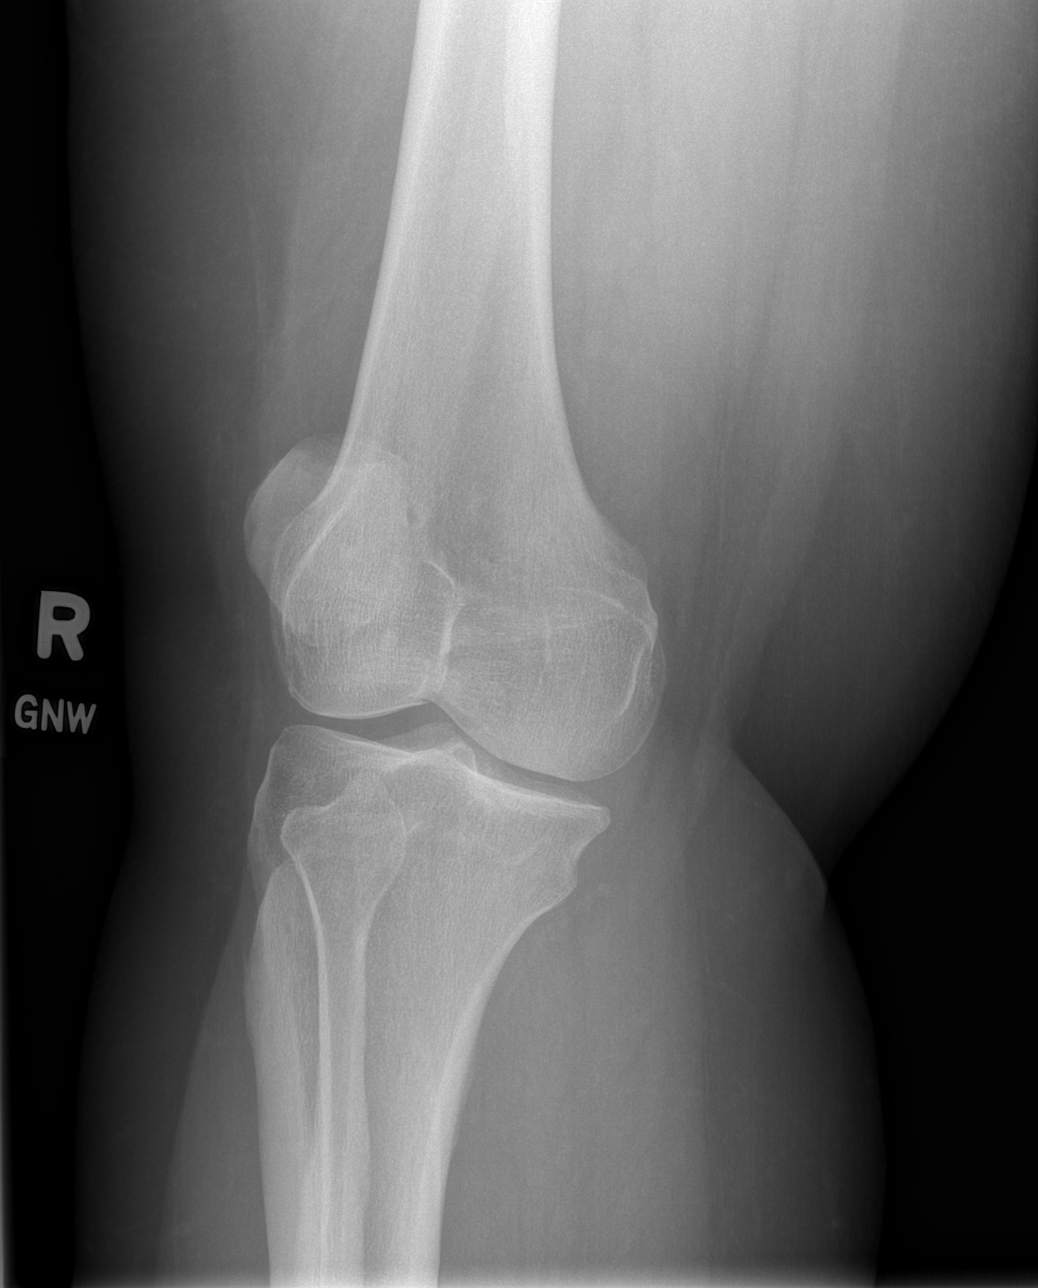

[t knee oblique right * (2 of 2)]
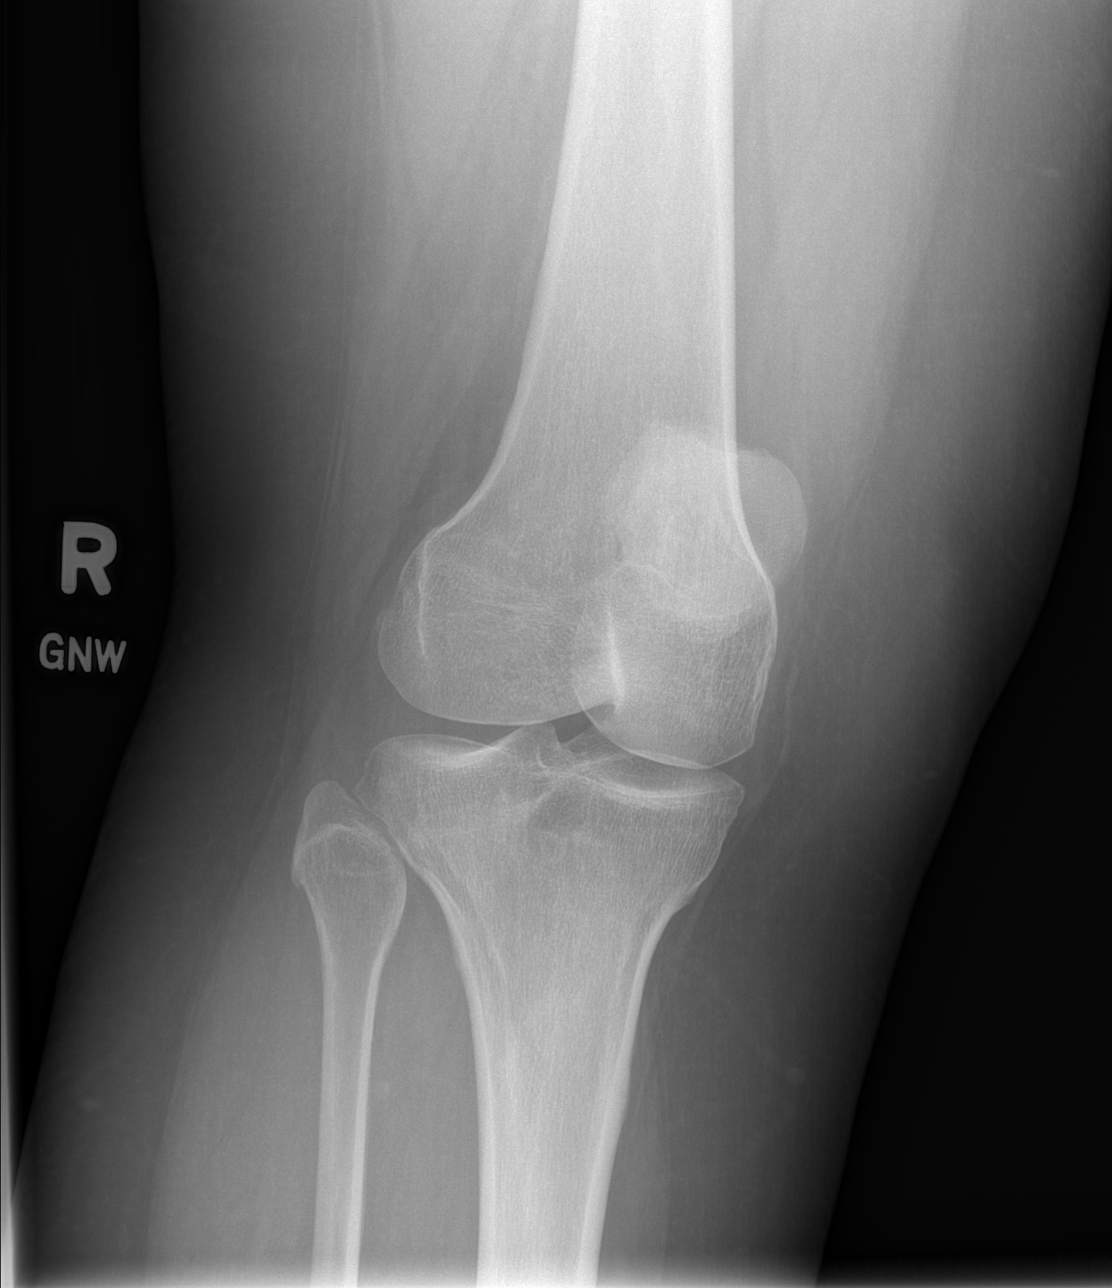

[t knee lat right *]
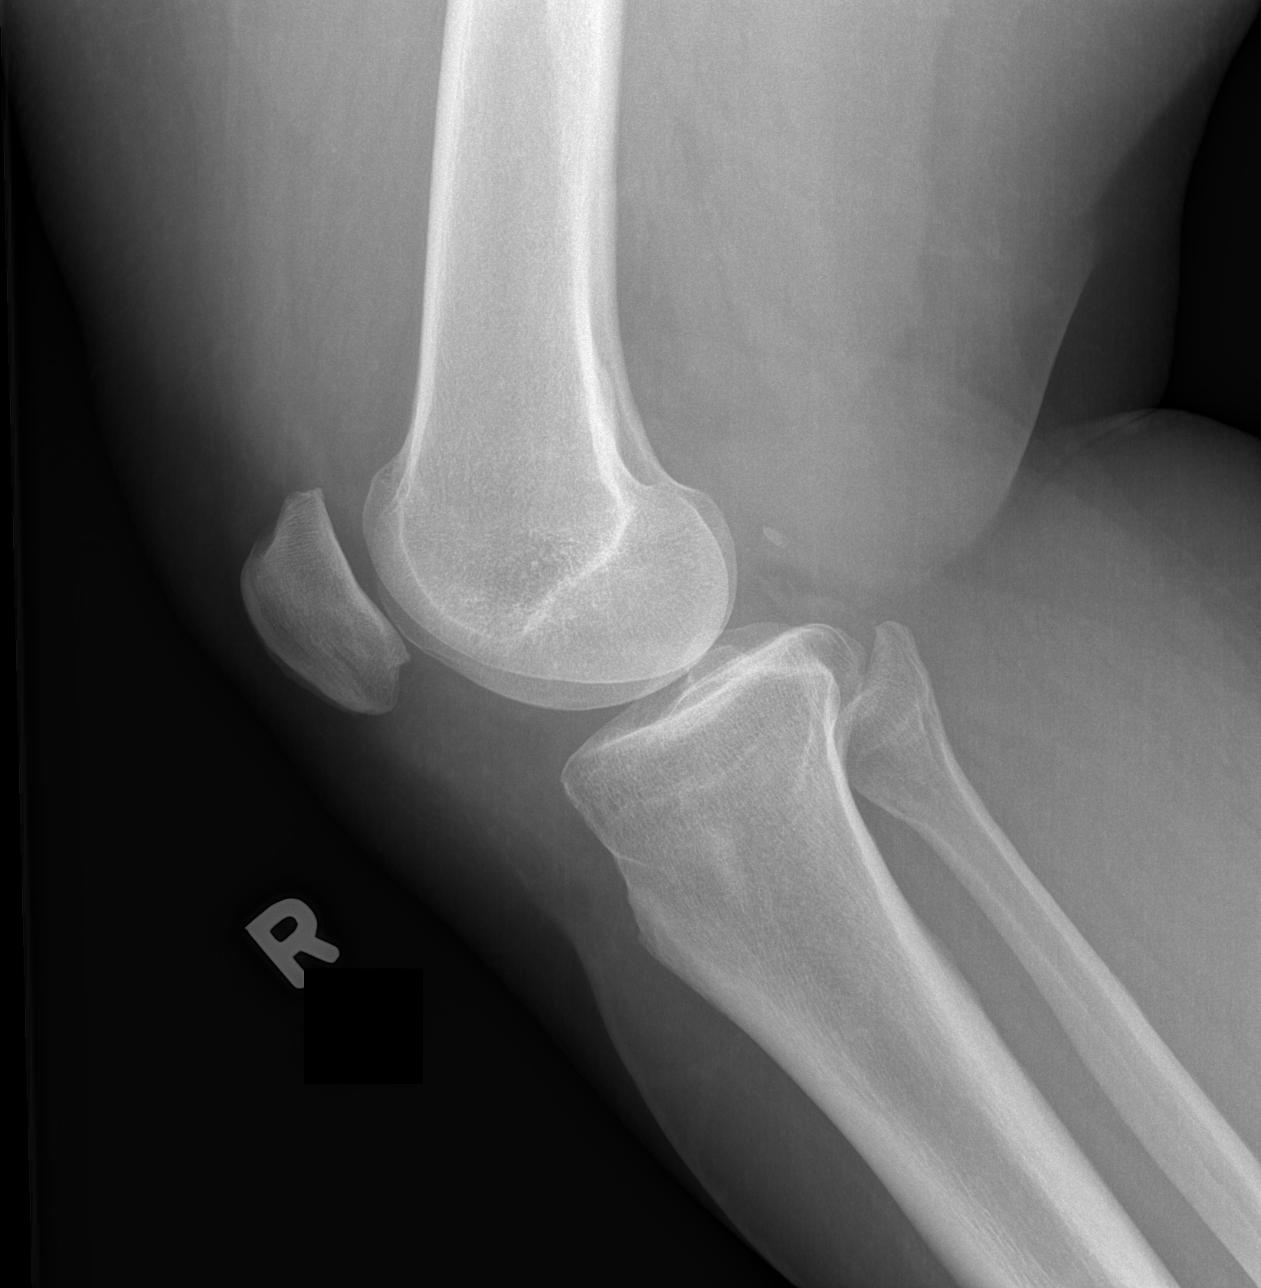

[4 of 4 positions shown; findings below may reference images not displayed]

FINDINGS: Very mild patellofemoral joint degenerative changes.
IMPRESSION: Very mild patellofemoral joint degenerative changes.

## 2013-03-20 DIAGNOSIS — G4733 Obstructive sleep apnea (adult) (pediatric): Secondary | ICD-10-CM | POA: Insufficient documentation

## 2013-06-28 ENCOUNTER — Emergency Department (HOSPITAL_COMMUNITY)
Admission: EM | Admit: 2013-06-28 | Discharge: 2013-06-28 | Disposition: A | Discharge status: Home / Self Care | Payer: Medicaid Other | Source: Home / Self Care | Attending: Emergency Medicine | Admitting: Emergency Medicine

## 2013-06-28 ENCOUNTER — Encounter (HOSPITAL_COMMUNITY): Payer: Self-pay | Admitting: Emergency Medicine

## 2013-06-28 DIAGNOSIS — K0401 Reversible pulpitis: Secondary | ICD-10-CM

## 2013-06-28 MED ORDER — MELOXICAM 15 MG PO TABS: 15.0000 mg | ORAL_TABLET | Freq: Every day | ORAL | Status: DC

## 2013-06-28 MED ORDER — OXYCODONE-ACETAMINOPHEN 5-325 MG PO TABS: ORAL_TABLET | ORAL | Status: DC

## 2013-06-28 MED ORDER — CLINDAMYCIN HCL 300 MG PO CAPS: 300.0000 mg | ORAL_CAPSULE | Freq: Four times a day (QID) | ORAL | Status: DC

## 2013-06-28 NOTE — ED Provider Notes (Signed)
Chief Complaint:   Chief Complaint  Patient presents with  . Facial Pain    History of Present Illness:   Claire Green is a 47 year old female who has had a one-week history of pain in her left rear teeth, she has one on the lower and one on the upper been hurting her. There's been no swelling of the gingiva or the face. No purulent drainage. She is able to open her mouth fully. It hurts to chew on that side. No swelling of the tongue or the floor the mouth. No difficulty breathing or swallowing. She denies any headache but does have some temporal pain. The pain radiates towards the ear and the ear feels like is congested. She has had some pain in the jaw and the neck as well. No chest pain or shortness of breath.  Review of Systems:  Other than noted above, the patient denies any of the following symptoms: Systemic:  No fever, chills,  Or sweats. ENT:  No headache, ear ache, sore throat, nasal congestion, facial pain, or swelling. Lymphatic:  No adenopathy. Lungs:  No coughing, wheezing or shortness of breath.  PMFSH:  Past medical history, family history, social history, meds, and allergies were reviewed.   Physical Exam:   Vital signs:  BP 148/74  Pulse 70  Temp(Src) 99.1 F (37.3 C) (Oral)  Resp 16  SpO2 100%  LMP 06/28/2013 General:  Alert, oriented, in no distress. ENT:  TMs and canals normal.  Nasal mucosa normal. Mouth exam:  The left upper second molar is carious as is the left lower second premolar. There was no purulent drainage, no swelling of the gingiva, no swelling of the tongue or the floor the mouth. The pharynx was clear. Neck:  No swelling or adenopathy. Lungs:  Breath sounds clear and equal bilaterally.  No wheezes, rales or rhonchi. Heart:  Regular rhythm.  No gallops or murmers. Skin:  Clear, warm and dry.   Assessment:  The encounter diagnosis was Pulpitis.  Plan:   1.  Meds:  The following meds were prescribed:   Discharge Medication List as of 06/28/2013   7:04 PM    START taking these medications   Details  clindamycin (CLEOCIN) 300 MG capsule Take 1 capsule (300 mg total) by mouth 4 (four) times daily., Starting 06/28/2013, Until Discontinued, Normal    meloxicam (MOBIC) 15 MG tablet Take 1 tablet (15 mg total) by mouth daily., Starting 06/28/2013, Until Discontinued, Normal    oxyCODONE-acetaminophen (PERCOCET) 5-325 MG per tablet 1 to 2 tablets every 6 hours as needed for pain., Print        2.  Patient Education/Counseling:  The patient was given appropriate handouts, self care instructions, and instructed in symptomatic relief. Suggested sleeping with head of bed elevated and hot salt water mouthwash.   3.  Follow up:  The patient was told to follow up if no better in 3 to 4 days, if becoming worse in any way, and given some red flag symptoms such as difficulty swallowing or breathing which would prompt immediate return.  Follow up with a dentist as soon as posssible.     Reuben Likes, MD 06/28/13 [phone]

## 2013-06-28 NOTE — ED Notes (Signed)
Pt  Reports  l  Sided  Facial  Pain  With  The  Pain  Radiating  From the  l  Upper  /  Lower  Gum area to    The  l  Ear     Pt  Is  Sitting  Upright on  The  Exam table  She  Is  Speaking in complete  sentances  And  Is  Awake  And  Alert  And    In no  Acute  distress

## 2013-06-30 ENCOUNTER — Telehealth (HOSPITAL_COMMUNITY): Payer: Self-pay | Admitting: Emergency Medicine

## 2013-06-30 MED ORDER — ONDANSETRON 8 MG PO TBDP: 8.0000 mg | ORAL_TABLET | Freq: Three times a day (TID) | ORAL | Status: DC | PRN

## 2013-06-30 NOTE — ED Notes (Signed)
The patient states that the clindamycin that she was taking for a tooth infection was making her nauseated. Since she is allergic to penicillins, there is really not another good alternative drug. Suggested that she try Zofran before taking the medication. We'll call prescription.  Reuben Likes, MD 06/30/13 [phone]

## 2013-06-30 NOTE — ED Notes (Signed)
Accessed record for patient question from pharmacy

## 2013-11-17 ENCOUNTER — Other Ambulatory Visit: Payer: Self-pay | Admitting: Nurse Practitioner

## 2013-11-17 DIAGNOSIS — Z1231 Encounter for screening mammogram for malignant neoplasm of breast: Secondary | ICD-10-CM

## 2013-12-05 ENCOUNTER — Ambulatory Visit
Admission: RE | Admit: 2013-12-05 | Discharge: 2013-12-05 | Disposition: A | Discharge status: Home / Self Care | Payer: Medicaid Other | Source: Ambulatory Visit | Attending: Nurse Practitioner | Admitting: Nurse Practitioner

## 2013-12-05 DIAGNOSIS — Z1231 Encounter for screening mammogram for malignant neoplasm of breast: Secondary | ICD-10-CM

## 2013-12-08 ENCOUNTER — Other Ambulatory Visit: Payer: Self-pay | Admitting: Nurse Practitioner

## 2013-12-08 DIAGNOSIS — R928 Other abnormal and inconclusive findings on diagnostic imaging of breast: Secondary | ICD-10-CM

## 2013-12-16 ENCOUNTER — Ambulatory Visit
Admission: RE | Admit: 2013-12-16 | Discharge: 2013-12-16 | Disposition: A | Discharge status: Home / Self Care | Payer: Medicaid Other | Source: Ambulatory Visit | Attending: Nurse Practitioner | Admitting: Nurse Practitioner

## 2013-12-16 ENCOUNTER — Encounter (INDEPENDENT_AMBULATORY_CARE_PROVIDER_SITE_OTHER): Payer: Self-pay

## 2013-12-16 DIAGNOSIS — R928 Other abnormal and inconclusive findings on diagnostic imaging of breast: Secondary | ICD-10-CM

## 2014-04-28 ENCOUNTER — Encounter: Payer: Medicaid Other | Admitting: Obstetrics & Gynecology

## 2014-05-05 ENCOUNTER — Ambulatory Visit (INDEPENDENT_AMBULATORY_CARE_PROVIDER_SITE_OTHER): Payer: Medicaid Other | Admitting: Obstetrics & Gynecology

## 2014-05-05 ENCOUNTER — Encounter: Payer: Self-pay | Admitting: Obstetrics & Gynecology

## 2014-05-05 VITALS — BP 120/77 | HR 82 | Ht 65.0 in | Wt 255.6 lb

## 2014-05-05 DIAGNOSIS — N920 Excessive and frequent menstruation with regular cycle: Secondary | ICD-10-CM

## 2014-05-05 MED ORDER — MISOPROSTOL 200 MCG PO TABS: ORAL_TABLET | ORAL | Status: DC

## 2014-05-05 NOTE — Progress Notes (Signed)
   Subjective:    Patient ID: Claire Green, female    DOB: [birth year], 48 y.o.   MRN: [id number]  HPI 48 yo MAA G3P3 (26, 27, 32) here today for evaluation of heavy periods and anemia. She was evaluated last month by a [facility] doctor but she decided to come to this practice. She reports that her periods last about a week each month. She denies dyspareunia. Her periods are very heavy with clots, wears 4 pads at a time. She sometimes has to change her clothes and sheets due to bleeding. Terrible clots.   Review of Systems She reports a normal pap recently as well as a normal mammogram.    Objective:   Physical Exam Abd- morbidly obese Bimanual reveals a 17 week size mobile uterus Her adnexa are not palpable     Assessment & Plan:  Menorrhagia with reported anemia-  Schedule gyn u/s Check TSH if this was not already done

## 2014-05-05 NOTE — Progress Notes (Signed)
Primary care at [facility] is referring her for possible fibroids and heavy periods.

## 2014-05-11 ENCOUNTER — Encounter: Payer: Medicaid Other | Admitting: Obstetrics and Gynecology

## 2014-05-14 ENCOUNTER — Telehealth: Payer: Self-pay | Admitting: *Deleted

## 2014-05-14 MED ORDER — DICLOFENAC POTASSIUM 50 MG PO TABS: 50.0000 mg | ORAL_TABLET | Freq: Three times a day (TID) | ORAL | Status: DC

## 2014-05-14 NOTE — Telephone Encounter (Signed)
Patient is calling because she has started her cycle and is having significant cramping which she usually doesn't have as bad as this.  Ibuprofen is not helping.  I have called in cataflam for her to see if this will help until she can get in for her ultrasound and endometrial biopsy.

## 2014-05-19 ENCOUNTER — Ambulatory Visit (HOSPITAL_COMMUNITY)
Admission: RE | Admit: 2014-05-19 | Discharge: 2014-05-19 | Disposition: A | Discharge status: Home / Self Care | Payer: Medicaid Other | Source: Ambulatory Visit | Attending: Obstetrics & Gynecology | Admitting: Obstetrics & Gynecology

## 2014-05-19 DIAGNOSIS — D251 Intramural leiomyoma of uterus: Secondary | ICD-10-CM | POA: Insufficient documentation

## 2014-05-19 DIAGNOSIS — N920 Excessive and frequent menstruation with regular cycle: Secondary | ICD-10-CM

## 2014-05-27 ENCOUNTER — Encounter: Payer: Self-pay | Admitting: Obstetrics & Gynecology

## 2014-05-27 ENCOUNTER — Ambulatory Visit (INDEPENDENT_AMBULATORY_CARE_PROVIDER_SITE_OTHER): Payer: Medicaid Other | Admitting: Obstetrics & Gynecology

## 2014-05-27 VITALS — BP 133/92 | HR 74 | Ht 65.0 in | Wt 260.0 lb

## 2014-05-27 DIAGNOSIS — N938 Other specified abnormal uterine and vaginal bleeding: Secondary | ICD-10-CM

## 2014-05-27 DIAGNOSIS — N949 Unspecified condition associated with female genital organs and menstrual cycle: Secondary | ICD-10-CM

## 2014-05-27 NOTE — Progress Notes (Signed)
   Subjective:    Patient ID: Claire Green Height, female    DOB: [birth year], 48 y.o.   MRN: [id number]  HPI She is here for an [facility] for her menorrhagia. Her u/s showed a 6 cm fundal intramural fibroid and a 7 mm endometrium. She did take cytotec last night and is cramping.  She tells me that another gyn told her that her thyroid was "borderline". We have not been able to get those records as of yet.   Review of Systems She tells me that sex has become more uncomfortable with time.     Objective:   Physical Exam  Consent signed, time out done Cervix prepped with betadine and grasped with a single tooth tenaculum I was unable to pass either the Pipelle or the dilator past about 3 cm so stopped attempting the biopsy. She tolerated the procedure well.        Assessment & Plan:  Menorrhagia with fibroid I willl check her TSH, free T4 and CBC If she is anemic as she tells me, then I have offered her a TAH/BS.

## 2014-05-28 LAB — CBC
HEMATOCRIT: 34.1 % — AB (ref 36.0–46.0)
HEMOGLOBIN: 10.3 g/dL — AB (ref 12.0–15.0)
MCH: 21.8 pg — ABNORMAL LOW (ref 26.0–34.0)
MCHC: 30.2 g/dL (ref 30.0–36.0)
MCV: 72.2 fL — AB (ref 78.0–100.0)
Platelets: 418 10*3/uL — ABNORMAL HIGH (ref 150–400)
RBC: 4.72 MIL/uL (ref 3.87–5.11)
RDW: 19.8 % — AB (ref 11.5–15.5)
WBC: 7.4 10*3/uL (ref 4.0–10.5)

## 2014-05-28 LAB — TSH: TSH: 4.517 u[IU]/mL — ABNORMAL HIGH (ref 0.350–4.500)

## 2014-05-28 LAB — T4, FREE: Free T4: 0.87 ng/dL (ref 0.80–1.80)

## 2014-06-03 ENCOUNTER — Encounter (HOSPITAL_COMMUNITY): Payer: Self-pay | Admitting: Pharmacist

## 2014-06-09 NOTE — Patient Instructions (Addendum)
   Your procedure is scheduled on:  Tuesday, Oct 20  Enter through the [employer] [facility] at:  [place] up the phone at the desk and dial [phone] and inform us of your arrival.  Please call this number if you have any problems the morning of surgery: [phone]  Remember: Do not eat or drink after midnight: Monday Take these medicines the morning of surgery with a SIP OF WATER:  Gabapentin, lisinopril-hctz, omeprazole, zoloft  Do not wear jewelry, make-up, or FINGER nail polish No metal in your hair or on your body. Do not wear lotions, powders, perfumes.  You may wear deodorant.  Do not bring valuables to the hospital. Contacts, dentures or bridgework may not be worn into surgery.  Leave suitcase in the car. After Surgery it may be brought to your room. For patients being admitted to the hospital, checkout time is 11:00am the day of discharge.  Home with sister Freda Munro cell [phone] or daughter-in-law Lorriane Shire cell [phone].

## 2014-06-12 ENCOUNTER — Encounter (HOSPITAL_COMMUNITY)
Admission: RE | Admit: 2014-06-12 | Discharge: 2014-06-12 | Disposition: A | Discharge status: Home / Self Care | Payer: Medicaid Other | Source: Ambulatory Visit | Attending: Obstetrics & Gynecology | Admitting: Obstetrics & Gynecology

## 2014-06-12 ENCOUNTER — Encounter (HOSPITAL_COMMUNITY): Payer: Self-pay

## 2014-06-12 DIAGNOSIS — Z01812 Encounter for preprocedural laboratory examination: Secondary | ICD-10-CM | POA: Insufficient documentation

## 2014-06-12 HISTORY — DX: Anemia, unspecified: D64.9

## 2014-06-12 LAB — CBC
HEMATOCRIT: 32.7 % — AB (ref 36.0–46.0)
Hemoglobin: 10.2 g/dL — ABNORMAL LOW (ref 12.0–15.0)
MCH: 22.1 pg — ABNORMAL LOW (ref 26.0–34.0)
MCHC: 31.2 g/dL (ref 30.0–36.0)
MCV: 70.9 fL — ABNORMAL LOW (ref 78.0–100.0)
PLATELETS: 354 10*3/uL (ref 150–400)
RBC: 4.61 MIL/uL (ref 3.87–5.11)
RDW: 18.4 % — AB (ref 11.5–15.5)
WBC: 6.5 10*3/uL (ref 4.0–10.5)

## 2014-06-12 LAB — BASIC METABOLIC PANEL
Anion gap: 9 (ref 5–15)
BUN: 10 mg/dL (ref 6–23)
CALCIUM: 8.7 mg/dL (ref 8.4–10.5)
CO2: 31 meq/L (ref 19–32)
CREATININE: 0.84 mg/dL (ref 0.50–1.10)
Chloride: 97 mEq/L (ref 96–112)
GFR calc Af Amer: >90 mL/min (ref >90)
GFR, EST NON AFRICAN AMERICAN: 81 mL/min — AB (ref >90)
GLUCOSE: 108 mg/dL — AB (ref 70–99)
Potassium: 3.3 mEq/L — ABNORMAL LOW (ref 3.7–5.3)
SODIUM: 137 meq/L (ref 137–147)

## 2014-06-16 ENCOUNTER — Inpatient Hospital Stay (HOSPITAL_COMMUNITY)
Admission: RE | Admit: 2014-06-16 | Discharge: 2014-06-18 | DRG: 742 | Disposition: A | Discharge status: Home / Self Care | Payer: Medicaid Other | Source: Ambulatory Visit | Attending: Obstetrics & Gynecology | Admitting: Obstetrics & Gynecology

## 2014-06-16 ENCOUNTER — Encounter (HOSPITAL_COMMUNITY)
Admission: RE | Disposition: A | Discharge status: Home / Self Care | Payer: Self-pay | Source: Ambulatory Visit | Attending: Obstetrics & Gynecology

## 2014-06-16 ENCOUNTER — Inpatient Hospital Stay (HOSPITAL_COMMUNITY): Payer: Medicaid Other | Admitting: Anesthesiology

## 2014-06-16 ENCOUNTER — Encounter (HOSPITAL_COMMUNITY): Payer: Medicaid Other | Admitting: Anesthesiology

## 2014-06-16 ENCOUNTER — Encounter (HOSPITAL_COMMUNITY): Payer: Self-pay | Admitting: Anesthesiology

## 2014-06-16 DIAGNOSIS — R0681 Apnea, not elsewhere classified: Secondary | ICD-10-CM | POA: Diagnosis present

## 2014-06-16 DIAGNOSIS — N830 Follicular cyst of ovary: Secondary | ICD-10-CM | POA: Diagnosis present

## 2014-06-16 DIAGNOSIS — N800 Endometriosis of uterus: Secondary | ICD-10-CM | POA: Diagnosis present

## 2014-06-16 DIAGNOSIS — D251 Intramural leiomyoma of uterus: Principal | ICD-10-CM | POA: Diagnosis present

## 2014-06-16 DIAGNOSIS — I10 Essential (primary) hypertension: Secondary | ICD-10-CM | POA: Diagnosis present

## 2014-06-16 DIAGNOSIS — Z6841 Body Mass Index (BMI) 40.0 and over, adult: Secondary | ICD-10-CM

## 2014-06-16 DIAGNOSIS — K219 Gastro-esophageal reflux disease without esophagitis: Secondary | ICD-10-CM | POA: Diagnosis present

## 2014-06-16 DIAGNOSIS — N920 Excessive and frequent menstruation with regular cycle: Secondary | ICD-10-CM | POA: Diagnosis present

## 2014-06-16 DIAGNOSIS — Z9889 Other specified postprocedural states: Secondary | ICD-10-CM

## 2014-06-16 DIAGNOSIS — Z880 Allergy status to penicillin: Secondary | ICD-10-CM

## 2014-06-16 HISTORY — PX: ABDOMINAL HYSTERECTOMY: SHX81

## 2014-06-16 LAB — PREGNANCY, URINE: Preg Test, Ur: NEGATIVE

## 2014-06-16 SURGERY — HYSTERECTOMY, ABDOMINAL
Anesthesia: General | Site: Abdomen | Laterality: Bilateral

## 2014-06-16 MED ORDER — ONDANSETRON HCL 4 MG PO TABS: 4.0000 mg | ORAL_TABLET | Freq: Four times a day (QID) | ORAL | Status: DC | PRN

## 2014-06-16 MED ORDER — ROCURONIUM BROMIDE 100 MG/10ML IV SOLN
INTRAVENOUS | Status: AC
  Filled 2014-06-16: qty 1

## 2014-06-16 MED ORDER — FENTANYL CITRATE 0.05 MG/ML IJ SOLN
INTRAMUSCULAR | Status: DC | PRN
  Administered 2014-06-16: 50 ug via INTRAVENOUS
  Administered 2014-06-16 (×3): 100 ug via INTRAVENOUS
  Administered 2014-06-16: 50 ug via INTRAVENOUS

## 2014-06-16 MED ORDER — SCOPOLAMINE 1 MG/3DAYS TD PT72
1.0000 | MEDICATED_PATCH | Freq: Once | TRANSDERMAL | Status: DC
  Administered 2014-06-16: 1.5 mg via TRANSDERMAL

## 2014-06-16 MED ORDER — PROPOFOL 10 MG/ML IV BOLUS
INTRAVENOUS | Status: DC | PRN
  Administered 2014-06-16: 200 mg via INTRAVENOUS

## 2014-06-16 MED ORDER — PANTOPRAZOLE SODIUM 40 MG PO TBEC
40.0000 mg | DELAYED_RELEASE_TABLET | Freq: Every day | ORAL | Status: DC
  Administered 2014-06-17 – 2014-06-18 (×2): 40 mg via ORAL
  Filled 2014-06-16 (×3): qty 1

## 2014-06-16 MED ORDER — HYDROMORPHONE HCL 1 MG/ML IJ SOLN
0.2500 mg | INTRAMUSCULAR | Status: DC | PRN
  Administered 2014-06-16 (×4): 0.5 mg via INTRAVENOUS

## 2014-06-16 MED ORDER — HYDROMORPHONE HCL 1 MG/ML IJ SOLN
0.2000 mg | INTRAMUSCULAR | Status: DC | PRN
  Administered 2014-06-16 (×2): 0.6 mg via INTRAVENOUS
  Filled 2014-06-16 (×2): qty 1

## 2014-06-16 MED ORDER — DEXTROSE IN LACTATED RINGERS 5 % IV SOLN
INTRAVENOUS | Status: DC
  Administered 2014-06-16 – 2014-06-17 (×2): via INTRAVENOUS

## 2014-06-16 MED ORDER — BUPIVACAINE HCL (PF) 0.5 % IJ SOLN
INTRAMUSCULAR | Status: AC
  Filled 2014-06-16: qty 30

## 2014-06-16 MED ORDER — ONDANSETRON HCL 4 MG/2ML IJ SOLN
4.0000 mg | Freq: Four times a day (QID) | INTRAMUSCULAR | Status: DC | PRN
Start: 2014-06-16 — End: 2014-06-18

## 2014-06-16 MED ORDER — DIPHENHYDRAMINE HCL 25 MG PO CAPS: 25.0000 mg | ORAL_CAPSULE | Freq: Four times a day (QID) | ORAL | Status: DC | PRN

## 2014-06-16 MED ORDER — MEPERIDINE HCL 25 MG/ML IJ SOLN
6.2500 mg | INTRAMUSCULAR | Status: DC | PRN
Start: 2014-06-16 — End: 2014-06-16

## 2014-06-16 MED ORDER — BUPIVACAINE HCL (PF) 0.5 % IJ SOLN
INTRAMUSCULAR | Status: DC | PRN
  Administered 2014-06-16: 30 mL

## 2014-06-16 MED ORDER — HYDROMORPHONE HCL 1 MG/ML IJ SOLN
INTRAMUSCULAR | Status: AC
  Filled 2014-06-16: qty 1

## 2014-06-16 MED ORDER — MORPHINE SULFATE 4 MG/ML IJ SOLN: 4.0000 mg | Freq: Once | INTRAMUSCULAR | Status: DC

## 2014-06-16 MED ORDER — NEOSTIGMINE METHYLSULFATE 10 MG/10ML IV SOLN
INTRAVENOUS | Status: DC | PRN
  Administered 2014-06-16: 5 mg via INTRAVENOUS

## 2014-06-16 MED ORDER — LACTATED RINGERS IV SOLN
INTRAVENOUS | Status: DC
  Administered 2014-06-16 (×3): via INTRAVENOUS

## 2014-06-16 MED ORDER — FENTANYL CITRATE 0.05 MG/ML IJ SOLN
INTRAMUSCULAR | Status: AC
  Filled 2014-06-16: qty 5

## 2014-06-16 MED ORDER — GLYCOPYRROLATE 0.2 MG/ML IJ SOLN
INTRAMUSCULAR | Status: AC
Start: 2014-06-16 — End: 2014-06-16
  Filled 2014-06-16: qty 3

## 2014-06-16 MED ORDER — HYDROMORPHONE HCL 1 MG/ML IJ SOLN
INTRAMUSCULAR | Status: DC | PRN
  Administered 2014-06-16: 2 mg via INTRAVENOUS

## 2014-06-16 MED ORDER — SCOPOLAMINE 1 MG/3DAYS TD PT72
MEDICATED_PATCH | TRANSDERMAL | Status: DC
Start: 2014-06-16 — End: 2014-06-18
  Administered 2014-06-16: 1.5 mg via TRANSDERMAL
  Filled 2014-06-16: qty 1

## 2014-06-16 MED ORDER — MIDAZOLAM HCL 2 MG/2ML IJ SOLN
INTRAMUSCULAR | Status: AC
  Filled 2014-06-16: qty 2

## 2014-06-16 MED ORDER — OXYCODONE-ACETAMINOPHEN 5-325 MG PO TABS
1.0000 | ORAL_TABLET | ORAL | Status: DC | PRN
  Administered 2014-06-16: 1 via ORAL
  Administered 2014-06-17: 2 via ORAL
  Administered 2014-06-17 (×3): 1 via ORAL
  Administered 2014-06-18: 2 via ORAL
  Filled 2014-06-16: qty 1
  Filled 2014-06-16: qty 2
  Filled 2014-06-16 (×2): qty 1
  Filled 2014-06-16: qty 2
  Filled 2014-06-16: qty 1

## 2014-06-16 MED ORDER — ZOLPIDEM TARTRATE 5 MG PO TABS
5.0000 mg | ORAL_TABLET | Freq: Every evening | ORAL | Status: DC | PRN
  Administered 2014-06-16 – 2014-06-17 (×2): 5 mg via ORAL
  Filled 2014-06-16 (×2): qty 1

## 2014-06-16 MED ORDER — ONDANSETRON HCL 4 MG/2ML IJ SOLN
INTRAMUSCULAR | Status: AC
  Filled 2014-06-16: qty 2

## 2014-06-16 MED ORDER — MIDAZOLAM HCL 2 MG/2ML IJ SOLN
INTRAMUSCULAR | Status: DC | PRN
  Administered 2014-06-16: 0.5 mg via INTRAVENOUS
  Administered 2014-06-16: 1.5 mg via INTRAVENOUS

## 2014-06-16 MED ORDER — GABAPENTIN 400 MG PO CAPS
400.0000 mg | ORAL_CAPSULE | Freq: Every day | ORAL | Status: DC
  Administered 2014-06-16 – 2014-06-17 (×2): 400 mg via ORAL
  Filled 2014-06-16 (×2): qty 1

## 2014-06-16 MED ORDER — DEXAMETHASONE SODIUM PHOSPHATE 10 MG/ML IJ SOLN
INTRAMUSCULAR | Status: DC | PRN
  Administered 2014-06-16: 5 mg via INTRAVENOUS

## 2014-06-16 MED ORDER — 0.9 % SODIUM CHLORIDE (POUR BTL) OPTIME
TOPICAL | Status: DC | PRN
  Administered 2014-06-16: 1000 mL

## 2014-06-16 MED ORDER — ONDANSETRON HCL 4 MG/2ML IJ SOLN
INTRAMUSCULAR | Status: DC | PRN
  Administered 2014-06-16 (×2): 2 mg via INTRAVENOUS

## 2014-06-16 MED ORDER — GLYCOPYRROLATE 0.2 MG/ML IJ SOLN
INTRAMUSCULAR | Status: AC
  Filled 2014-06-16: qty 1

## 2014-06-16 MED ORDER — GLYCOPYRROLATE 0.2 MG/ML IJ SOLN
INTRAMUSCULAR | Status: DC | PRN
  Administered 2014-06-16: 0.6 mg via INTRAVENOUS
  Administered 2014-06-16 (×2): 0.1 mg via INTRAVENOUS

## 2014-06-16 MED ORDER — CEFAZOLIN SODIUM-DEXTROSE 2-3 GM-% IV SOLR
2.0000 g | INTRAVENOUS | Status: AC
  Administered 2014-06-16: 2 g via INTRAVENOUS

## 2014-06-16 MED ORDER — NEOSTIGMINE METHYLSULFATE 10 MG/10ML IV SOLN
INTRAVENOUS | Status: AC
  Filled 2014-06-16: qty 1

## 2014-06-16 MED ORDER — SERTRALINE HCL 100 MG PO TABS
100.0000 mg | ORAL_TABLET | Freq: Every day | ORAL | Status: DC
  Filled 2014-06-16 (×2): qty 1

## 2014-06-16 MED ORDER — METOCLOPRAMIDE HCL 5 MG/ML IJ SOLN: 10.0000 mg | Freq: Once | INTRAMUSCULAR | Status: DC | PRN

## 2014-06-16 MED ORDER — ROCURONIUM BROMIDE 100 MG/10ML IV SOLN
INTRAVENOUS | Status: DC | PRN
  Administered 2014-06-16: 60 mg via INTRAVENOUS

## 2014-06-16 MED ORDER — GABAPENTIN 300 MG PO CAPS
300.0000 mg | ORAL_CAPSULE | Freq: Every morning | ORAL | Status: DC
  Administered 2014-06-17 – 2014-06-18 (×2): 300 mg via ORAL
  Filled 2014-06-16 (×3): qty 1

## 2014-06-16 MED ORDER — CEFAZOLIN SODIUM-DEXTROSE 2-3 GM-% IV SOLR
INTRAVENOUS | Status: AC
  Filled 2014-06-16: qty 50

## 2014-06-16 MED ORDER — LIDOCAINE HCL (CARDIAC) 20 MG/ML IV SOLN
INTRAVENOUS | Status: AC
  Filled 2014-06-16: qty 5

## 2014-06-16 MED ORDER — PROPOFOL 10 MG/ML IV EMUL
INTRAVENOUS | Status: AC
  Filled 2014-06-16: qty 20

## 2014-06-16 MED ORDER — DIPHENHYDRAMINE HCL 25 MG PO CAPS
25.0000 mg | ORAL_CAPSULE | Freq: Four times a day (QID) | ORAL | Status: DC | PRN
  Administered 2014-06-16: 25 mg via ORAL
  Filled 2014-06-16: qty 1

## 2014-06-16 MED ORDER — LIDOCAINE HCL (CARDIAC) 20 MG/ML IV SOLN
INTRAVENOUS | Status: DC | PRN
  Administered 2014-06-16: 100 mg via INTRAVENOUS

## 2014-06-16 MED ORDER — SERTRALINE HCL 50 MG PO TABS
150.0000 mg | ORAL_TABLET | Freq: Every day | ORAL | Status: DC
  Administered 2014-06-16 – 2014-06-18 (×3): 150 mg via ORAL
  Filled 2014-06-16 (×3): qty 1

## 2014-06-16 MED ORDER — KETOROLAC TROMETHAMINE 30 MG/ML IJ SOLN
INTRAMUSCULAR | Status: AC
  Filled 2014-06-16: qty 1

## 2014-06-16 MED ORDER — DEXAMETHASONE SODIUM PHOSPHATE 10 MG/ML IJ SOLN
INTRAMUSCULAR | Status: AC
  Filled 2014-06-16: qty 1

## 2014-06-16 MED ORDER — KETOROLAC TROMETHAMINE 30 MG/ML IJ SOLN
INTRAMUSCULAR | Status: DC | PRN
  Administered 2014-06-16: 30 mg via INTRAVENOUS

## 2014-06-16 MED ORDER — IBUPROFEN 800 MG PO TABS
800.0000 mg | ORAL_TABLET | Freq: Three times a day (TID) | ORAL | Status: DC | PRN
  Administered 2014-06-16 – 2014-06-17 (×3): 800 mg via ORAL
  Filled 2014-06-16 (×3): qty 1

## 2014-06-16 SURGICAL SUPPLY — 31 items
CANISTER SUCT 3000ML (MISCELLANEOUS) ×3 IMPLANT
CLOSURE WOUND 1/2 X4 (GAUZE/BANDAGES/DRESSINGS) ×1
CLOTH BEACON ORANGE TIMEOUT ST (SAFETY) ×3 IMPLANT
CONT PATH 16OZ SNAP LID 3702 (MISCELLANEOUS) ×3 IMPLANT
DECANTER SPIKE VIAL GLASS SM (MISCELLANEOUS) ×3 IMPLANT
DRAPE CESAREAN BIRTH W POUCH (DRAPES) ×3 IMPLANT
DRAPE WARM FLUID 44X44 (DRAPE) ×3 IMPLANT
DRSG OPSITE POSTOP 4X10 (GAUZE/BANDAGES/DRESSINGS) ×3 IMPLANT
DURAPREP 26ML APPLICATOR (WOUND CARE) ×9 IMPLANT
GAUZE SPONGE 4X4 16PLY XRAY LF (GAUZE/BANDAGES/DRESSINGS) ×3 IMPLANT
GLOVE BIO SURGEON STRL SZ 6.5 (GLOVE) ×2 IMPLANT
GLOVE BIO SURGEONS STRL SZ 6.5 (GLOVE) ×1
GLOVE BIOGEL PI IND STRL 7.0 (GLOVE) ×2 IMPLANT
GLOVE BIOGEL PI INDICATOR 7.0 (GLOVE) ×4
GOWN STRL REUS W/TWL LRG LVL3 (GOWN DISPOSABLE) ×9 IMPLANT
NEEDLE SPNL 18GX3.5 QUINCKE PK (NEEDLE) ×3 IMPLANT
NS IRRIG 1000ML POUR BTL (IV SOLUTION) ×3 IMPLANT
PACK ABDOMINAL GYN (CUSTOM PROCEDURE TRAY) ×3 IMPLANT
PAD OB MATERNITY 4.3X12.25 (PERSONAL CARE ITEMS) ×3 IMPLANT
PROTECTOR NERVE ULNAR (MISCELLANEOUS) ×3 IMPLANT
SPONGE LAP 18X18 X RAY DECT (DISPOSABLE) ×12 IMPLANT
STRIP CLOSURE SKIN 1/2X4 (GAUZE/BANDAGES/DRESSINGS) ×2 IMPLANT
SUT CHROMIC 3 0 SH 27 (SUTURE) IMPLANT
SUT VIC AB 0 CT1 36 (SUTURE) ×9 IMPLANT
SUT VIC AB 2-0 CT1 18 (SUTURE) ×9 IMPLANT
SUT VIC AB 3-0 CT1 27 (SUTURE) ×3
SUT VIC AB 3-0 CT1 TAPERPNT 27 (SUTURE) ×1 IMPLANT
SYR 30ML LL (SYRINGE) ×3 IMPLANT
TOWEL OR 17X24 6PK STRL BLUE (TOWEL DISPOSABLE) ×6 IMPLANT
TRAY FOLEY CATH 14FR (SET/KITS/TRAYS/PACK) ×3 IMPLANT
WATER STERILE IRR 1000ML POUR (IV SOLUTION) ×3 IMPLANT

## 2014-06-16 NOTE — Op Note (Signed)
06/16/2014  11:57 AM  PATIENT:  Claire Green Height  48 y.o. female  PRE-OPERATIVE DIAGNOSIS:  Fibroid, menorrhagia, anemia, morbid obesity  POST-OPERATIVE DIAGNOSIS:  Same + dense scarring from previous surgeries  PROCEDURE:  SUPRACERVICAL HYSTERECTOMY, LEFT SALPINGO-OPHERECTOMY, RIGHT SALPINGECTOMY  SURGEON:  Surgeon(s) and Role:    * Emily Filbert, MD   ASSISTANTS: Mora Bellman, MD   ANESTHESIA:   general  EBL:  Total I/O In: 2000 [I.V.:2000] Out: 950 [Urine:500; Blood:450]  BLOOD ADMINISTERED:none  DRAINS: none   LOCAL MEDICATIONS USED:  MARCAINE     SPECIMEN:  Source of Specimen:  uterus (with portion of cervix), left ovary and tube, right tube  DISPOSITION OF SPECIMEN:  PATHOLOGY  COUNTS:  YES  TOURNIQUET:  * No tourniquets in log *  DICTATION: .Dragon Dictation  PLAN OF CARE: Admit to inpatient   PATIENT DISPOSITION:  PACU - hemodynamically stable.   Delay start of Pharmacological VTE agent (>24hrs) due to surgical blood loss or risk of bleeding: not applicable     The risks, benefits, and alternatives of surgery were explained, understood, and accepted. Consents were signed. All questions were answered. She was taken to the operating room and general anesthesia was applied without complication. Her abdomen and vagina were prepped and draped in the usual sterile fashion. A Foley catheter was placed which drained clear urine throughout the case. After adequate anesthesia was assured a transverse incision was made approximately 1 cm above her previous cesarean sections' scar after injecting 30 mL of 0.5% marcaine in the subcutaneous tissue. Please note that her previous scar was extremely low, actually just above the symphysis pubis.The incision was carried down through the subcutaneous tissue to the fascia. Bleeding encountered was cauterized with the Bovie. The fascia was scored the midline. There were dense adhesions here, very little demarcation between her fascia  and rectus muscles. I made a transverse incision through this tissue with the Bovie, taking care not to injure the bowel. Hemostasis was maintained. The peritoneum was entered with hemostats and the peritoneal incision was extended bilaterally with the Bovie, taking care to avoid bowel and bladder. The patient was placed in Trendelenburg position and her bowel was packed out of the abdominal cavity. The pelvis was inspected. Her very large uterus filled the entire pelvis. I used towel clamps to elevate the uterus out of the incision. Coker clamps were used to elevate the uterus. The round ligaments were identified clamped cut and ligated. A bladder flap was created anteriorly and the bladder was pushed out of the operative site with a moist lap sponge. The left ovary was densely adherent to the back side of the uterus. The uteroovarian ligaments were identified bilaterally. The one on the right was clamped, cut, and ligated. Excellent hemostasis was noted. 2-0 Vicryl sutures used throughout this case unless otherwise specified. On the right, I was not able to separate the ovary from the back side of the uterus, so I identified the IP ligament and clamped, cut and ligated it.The uterine vessels were skeletonized, clamped, cut, and doubly ligated. At this point we removed the uterus so as to afford better visualization. The cervix was grasped with Kocher clamps. A bladder flap was created anteriorly. We were able to safely bring the bladder half way down the very long cervix, but due to the dense bladder adhesions, we could not be assured of keeping the bladder safe if we tried to dissect it any lower on the remaining cervix. Therefore, I removed only a  portion of the cervix. I closed the cervical cuff with 2 layers of 0 vicryl suture in a running, locking fashion. Excellent hemostasis was noted. The pelvis was irrigated with 650 cc of warm normal saline. All pedicles were noted to be hemostatic.  The ureters were  noted to be functioning and of normal caliber. The sponges were removed from the pelvis. The rectus muscles were inspected and hemostasis was assured. The fascia was closed with a 0 PDS loop running nonlocking suture. The subcutaneous tissue was irrigated, clean, dried.  A subcuticular closure was done with 3-0 vicryl suture. She tolerated the procedure well and was taken to the recovery room in stable condition. Her Foley catheter drained clear urine throughout.

## 2014-06-16 NOTE — H&P (Addendum)
Claire Green is an 48 y.o. S AA  female.  She was evaluated last month by a [facility] doctor but she decided to come to this practice. She reports that her periods last about a week each month. She denies dyspareunia. Her periods are very heavy with clots, wears 4 pads at a time. She sometimes has to change her clothes and sheets due to bleeding. Terrible clots. Her u/s showed an enlarged uterus with a 6 cm fibroid.   Pertinent Gynecological History: Menses: flow is excessive with use of 4 overnight pads pads or tampons on heaviest days Bleeding: 7 days per month Contraception: none s/p BTL DES exposure: denies Blood transfusions: none Sexually transmitted diseases: no past history Previous GYN Procedures: none  Last mammogram: normal Date: 2015 Last pap: normal Date: 2015 OB History: G3, P3 (all cesareans)   Menstrual History: Menarche age: 48 Patient's last menstrual period was 05/23/2014.    Past Medical History  Diagnosis Date  . Hypertension   . Arthritis   . Anxiety   . Depression   . GERD (gastroesophageal reflux disease)   . Strep throat   . Bronchitis   . Arthritis, low back   . Morbid obesity   . Apnea, sleep     Does not use CPAP every night but some nights  . Anemia     Past Surgical History  Procedure Laterality Date  . C section  1982, 1988, 1989    x 3  . Breast surgery  1997    cyst removed bilat breast  . Tubal ligation  1989  . Wisdom tooth extraction      No family history on file.  Social History:  reports that she has never smoked. She has never used smokeless tobacco. She reports that she does not drink alcohol or use illicit drugs.  Allergies:  Allergies  Allergen Reactions  . Penicillins Rash    Childhood reaction, and thinks she had hives as young adult Has taken amoxicillin without a problem    Prescriptions prior to admission  Medication Sig Dispense Refill  . CALCIUM PO Take 1 tablet by mouth daily.      . Cholecalciferol (VITAMIN  D PO) Take 1 tablet by mouth daily.      Marland Kitchen gabapentin (NEURONTIN) 300 MG capsule Take 300 mg by mouth every morning.       . gabapentin (NEURONTIN) 400 MG capsule Take 400 mg by mouth every evening.      . hydrOXYzine (VISTARIL) 25 MG capsule Take 75 mg by mouth at bedtime.      Marland Kitchen lisinopril-hydrochlorothiazide (PRINZIDE,ZESTORETIC) 20-25 MG per tablet Take 1 tablet by mouth daily.      Marland Kitchen omeprazole (PRILOSEC) 20 MG capsule Take 20 mg by mouth daily.      . Prenatal Vit-Fe Fumarate-FA (MULTIVITAMIN-PRENATAL) 27-0.8 MG TABS tablet Take 1 tablet by mouth daily at 12 noon.      . sertraline (ZOLOFT) 100 MG tablet Take 150 mg by mouth daily.          ROS Measurements: 13.5 x 9.8 x 9.2 cm. Anterior intramural fibroid  measures up to 6.3 cm. Anterior midbody fibroid measures up to 1.2  cm. Diffusely heterogeneous echotexture throughout the uterus.   Blood pressure 153/79, pulse 68, temperature 98.4 F (36.9 C), temperature source Oral, resp. rate 18, last menstrual period 05/23/2014, SpO2 99.00%. Physical Exam Heart- rrr Lungs- CTAB Abd- benign, obese  Results for orders placed during the hospital encounter of 06/16/14 (from  the past 24 hour(s))  PREGNANCY, URINE     Status: None   Collection Time    06/16/14  8:44 AM      Result Value Ref Range   Preg Test, Ur NEGATIVE  NEGATIVE    No results found.  Assessment/Plan: Menorrhagia, anemia, fibroids- plan for TAH/BS.  She understands the risks of surgery, including, but not to infection, bleeding, DVTs, damage to bowel, bladder, ureters. She wishes to proceed.     Damonta Cossey C. 06/16/2014, 9:21 AM

## 2014-06-16 NOTE — Anesthesia Preprocedure Evaluation (Addendum)
Anesthesia Evaluation  Patient identified by MRN, date of birth, ID band Patient awake    Reviewed: Allergy & Precautions, H&P , NPO status , Patient's Chart, lab work & pertinent test results, reviewed documented beta blocker date and time   Airway Mallampati: I TM Distance: >3 FB Neck ROM: Full    Dental no notable dental hx. (+) Upper Dentures   Pulmonary sleep apnea and Continuous Positive Airway Pressure Ventilation ,  breath sounds clear to auscultation  Pulmonary exam normal       Cardiovascular hypertension, Pt. on medications Rhythm:Regular Rate:Normal     Neuro/Psych PSYCHIATRIC DISORDERS Anxiety Depression    GI/Hepatic GERD-  Medicated and Controlled,  Endo/Other  Morbid obesity  Renal/GU   negative genitourinary   Musculoskeletal  (+) Arthritis -, Osteoarthritis,    Abdominal (+) + obese,   Peds  Hematology  (+) anemia ,   Anesthesia Other Findings   Reproductive/Obstetrics Menorrhagia Intramural fibroid 6cm                         Anesthesia Physical Anesthesia Plan  ASA: III  Anesthesia Plan: General   Post-op Pain Management:    Induction: Intravenous  Airway Management Planned: Oral ETT  Additional Equipment:   Intra-op Plan:   Post-operative Plan: Extubation in OR  Informed Consent: I have reviewed the patients History and Physical, chart, labs and discussed the procedure including the risks, benefits and alternatives for the proposed anesthesia with the patient or authorized representative who has indicated his/her understanding and acceptance.   Dental advisory given  Plan Discussed with: CRNA, Anesthesiologist and Surgeon  Anesthesia Plan Comments:         Anesthesia Quick Evaluation

## 2014-06-16 NOTE — Anesthesia Postprocedure Evaluation (Signed)
  Anesthesia Post-op Note  Patient: Claire Green  Procedure(s) Performed: Procedure(s) with comments: SUPERCERVICAL HYSTERECTOMY ABDOMINAL, LEFT SALPINGO-OPPHERECTOMY, AND RIGHT SALPINGECTOMY (Bilateral) - With bilateral salpingectomy  Patient Location: PACU  Anesthesia Type:General  Level of Consciousness: awake, alert  and oriented  Airway and Oxygen Therapy: Patient Spontanous Breathing and Patient connected to nasal cannula oxygen  Post-op Pain: moderate  Post-op Assessment: Post-op Vital signs reviewed, Patient's Cardiovascular Status Stable, Respiratory Function Stable, Patent Airway, No signs of Nausea or vomiting and Pain level controlled  Post-op Vital Signs: Reviewed and stable  Last Vitals:  Filed Vitals:   06/16/14 1245  BP: 120/57  Pulse: 79  Temp:   Resp: 10    Complications: No apparent anesthesia complications

## 2014-06-16 NOTE — Transfer of Care (Signed)
Immediate Anesthesia Transfer of Care Note  Patient: Claire Green  Procedure(s) Performed: Procedure(s) with comments: SUPERCERVICAL HYSTERECTOMY ABDOMINAL, LEFT SALPINGO-OPPHERECTOMY, AND RIGHT SALPINGECTOMY (Bilateral) - With bilateral salpingectomy  Patient Location: PACU  Anesthesia Type:General  Level of Consciousness: awake, alert  and oriented  Airway & Oxygen Therapy: Patient Spontanous Breathing and Patient connected to nasal cannula oxygen  Post-op Assessment: Report given to PACU RN, Post -op Vital signs reviewed and stable and Patient moving all extremities X 4  Post vital signs: Reviewed and stable  Complications: No apparent anesthesia complications

## 2014-06-17 ENCOUNTER — Encounter (HOSPITAL_COMMUNITY): Payer: Self-pay | Admitting: Obstetrics & Gynecology

## 2014-06-17 LAB — CBC
HCT: 27.2 % — ABNORMAL LOW (ref 36.0–46.0)
Hemoglobin: 8.5 g/dL — ABNORMAL LOW (ref 12.0–15.0)
MCH: 22.5 pg — AB (ref 26.0–34.0)
MCHC: 31.3 g/dL (ref 30.0–36.0)
MCV: 72 fL — ABNORMAL LOW (ref 78.0–100.0)
Platelets: 318 10*3/uL (ref 150–400)
RBC: 3.78 MIL/uL — ABNORMAL LOW (ref 3.87–5.11)
RDW: 18.8 % — AB (ref 11.5–15.5)
WBC: 14.2 10*3/uL — ABNORMAL HIGH (ref 4.0–10.5)

## 2014-06-17 MED ORDER — LISINOPRIL 20 MG PO TABS
20.0000 mg | ORAL_TABLET | Freq: Every day | ORAL | Status: DC
  Administered 2014-06-17 – 2014-06-18 (×2): 20 mg via ORAL
  Filled 2014-06-17 (×2): qty 1

## 2014-06-17 MED ORDER — HYDROCHLOROTHIAZIDE 25 MG PO TABS
25.0000 mg | ORAL_TABLET | Freq: Every day | ORAL | Status: DC
  Administered 2014-06-17 – 2014-06-18 (×2): 25 mg via ORAL
  Filled 2014-06-17 (×2): qty 1

## 2014-06-17 NOTE — Anesthesia Postprocedure Evaluation (Signed)
  Anesthesia Post-op Note  Patient: Claire Green  Procedure(s) Performed: Procedure(s) with comments: SUPERCERVICAL HYSTERECTOMY ABDOMINAL, LEFT SALPINGO-OPPHERECTOMY, AND RIGHT SALPINGECTOMY (Bilateral) - With bilateral salpingectomy  Patient Location: PACU and Women's Unit  Anesthesia Type:General  Level of Consciousness: awake, alert  and oriented  Airway and Oxygen Therapy: Patient Spontanous Breathing  Post-op Pain: mild  Post-op Assessment: Patient's Cardiovascular Status Stable, Respiratory Function Stable, No signs of Nausea or vomiting, Adequate PO intake and Pain level controlled  Post-op Vital Signs: Reviewed and stable  Last Vitals:  Filed Vitals:   06/17/14 0518  BP: 121/56  Pulse: 61  Temp: 36.8 C  Resp: 16    Complications: No apparent anesthesia complications

## 2014-06-17 NOTE — Addendum Note (Signed)
Addendum created 06/17/14 0907 by Elenore Paddy, CRNA   Modules edited: Notes Section   Notes Section:  File: [id number]

## 2014-06-17 NOTE — Progress Notes (Signed)
Ur chart review completed.  

## 2014-06-17 NOTE — Progress Notes (Signed)
1 Day Post-Op Procedure(s) (LRB): SUPERCERVICAL HYSTERECTOMY ABDOMINAL, LEFT SALPINGO-OPPHERECTOMY, AND RIGHT SALPINGECTOMY (Bilateral)  Subjective: Patient reports tolerating PO.  She is ambulating without difficulty.  Objective: I have reviewed patient's vital signs, intake and output, medications and labs.  General: alert Resp: clear to auscultation bilaterally Cardio: regular rate and rhythm, S1, S2 normal, no murmur, click, rub or gallop GI: soft, non-tender; bowel sounds normal; no masses,  no organomegaly Dressing- C/D/I  Assessment: s/p Procedure(s) with comments: SUPERCERVICAL HYSTERECTOMY ABDOMINAL, LEFT SALPINGO-OPPHERECTOMY, AND RIGHT SALPINGECTOMY (Bilateral) - With bilateral salpingectomy: stable and progressing well  Plan: Encourage ambulation Discontinue IV fluids  LOS: 1 day    Tramya Schoenfelder C. 06/17/2014, 7:30 AM

## 2014-06-18 DIAGNOSIS — D649 Anemia, unspecified: Secondary | ICD-10-CM

## 2014-06-18 DIAGNOSIS — D259 Leiomyoma of uterus, unspecified: Secondary | ICD-10-CM

## 2014-06-18 MED ORDER — OXYCODONE-ACETAMINOPHEN 5-325 MG PO TABS: 1.0000 | ORAL_TABLET | ORAL | Status: DC | PRN

## 2014-06-18 MED ORDER — IBUPROFEN 800 MG PO TABS: 800.0000 mg | ORAL_TABLET | Freq: Three times a day (TID) | ORAL | Status: DC | PRN

## 2014-06-18 NOTE — Discharge Instructions (Signed)
Abdominal Hysterectomy, Care After Refer to this sheet in the next few weeks. These instructions provide you with information on caring for yourself after your procedure. Your health care provider may also give you more specific instructions. Your treatment has been planned according to current medical practices, but problems sometimes occur. Call your health care provider if you have any problems or questions after your procedure.  WHAT TO EXPECT AFTER THE PROCEDURE After your procedure, it is typical to have the following:  Pain.  Feeling tired.  Poor appetite.  Less interest in sex. HOME CARE INSTRUCTIONS  It takes 4-6 weeks to recover from this surgery. Make sure you follow all your health care provider's instructions. Home care instructions may include:  Take pain medicines only as directed by your health care provider. Do not take over-the-counter pain medicines without checking with your health care provider first.  Change your bandage as directed by your health care provider.  Return to your health care provider to have your sutures taken out.  Take showers instead of baths for 2-3 weeks. Ask your health care provider when it is safe to start showering.  Do not douche, use tampons, or have sexual intercourse for at least 6 weeks or until your health care provider says you can.   Follow your health care provider's advice about exercise, lifting, driving, and general activities.  Get plenty of rest and sleep.   Do not lift anything heavier than a gallon of milk (about 10 lb [4.5 kg]) for the first month after surgery.  You can resume your normal diet if your health care provider says it is okay.   Do not drink alcohol until your health care provider says you can.   If you are constipated, ask your health care provider if you can take a mild laxative.  Eating foods high in fiber may also help with constipation. Eat plenty of raw fruits and vegetables, whole grains, and  beans.  Drink enough fluids to keep your urine clear or pale yellow.   Try to have someone at home with you for the first 1-2 weeks to help around the house.  Keep all follow-up appointments. SEEK MEDICAL CARE IF:   You have chills or fever.  You have swelling, redness, or pain in the area of your incision that is getting worse.   You have pus coming from the incision.   You notice a bad smell coming from the incision or bandage.   Your incision breaks open.   You feel dizzy or light-headed.   You have pain or bleeding when you urinate.   You have persistent diarrhea.   You have persistent nausea and vomiting.   You have abnormal vaginal discharge.   You have a rash.   You have any type of abnormal reaction or develop an allergy to your medicine.   Your pain medicine is not helping.  SEEK IMMEDIATE MEDICAL CARE IF:   You have a fever and your symptoms suddenly get worse.  You have severe abdominal pain.  You have chest pain.  You have shortness of breath.  You faint.  You have pain, swelling, or redness of your leg.  You have heavy vaginal bleeding with blood clots. MAKE SURE YOU:  Understand these instructions.  Will watch your condition.  Will get help right away if you are not doing well or get worse. Document Released: 03/03/2005 Document Revised: 08/19/2013 Document Reviewed: 06/06/2013 Exit[employer] Patient Information 2015 [employer]. This information is not intended   to replace advice given to you by your health care provider. Make sure you discuss any questions you have with your health care provider.  

## 2014-06-18 NOTE — Discharge Summary (Signed)
Physician Discharge Summary  Patient ID: Claire Green MRN: [id number] DOB/AGE: 27-Nov-1965 48 y.o.  Admit date: 06/16/2014 Discharge date: 06/18/2014  Admission Diagnoses: symptomatic fibroids, anemia, morbid obesity  Discharge Diagnoses: same Active Problems:   Post-operative state   Discharged Condition: good  Hospital Course: She underwent an uncomplicated supracervical hysterectomy, right salpingectomy, left salpingo-opherectomy. Post operatively she did well, voiding, tolerating po, and having flatus. She voiced her readiness to go home today.  Consults: None  Significant Diagnostic Studies: labs: post op 8.6  Treatments: surgery: as above  Discharge Exam: Blood pressure 125/52, pulse 66, temperature 98.4 F (36.9 C), temperature source Oral, resp. rate 18, height 5\' 5"  (1.651 m), weight 119.296 kg (263 lb), last menstrual period 05/23/2014, SpO2 99.00%. General appearance: alert Resp: clear to auscultation bilaterally Cardio: regular rate and rhythm, S1, S2 normal, no murmur, click, rub or gallop GI: soft, non-tender; bowel sounds normal; no masses,  no organomegaly Incision/Wound: dressing c/d/i  Disposition: 01-Home or Self Care     Medication List         CALCIUM PO  Take 1 tablet by mouth daily.     gabapentin 400 MG capsule  Commonly known as:  NEURONTIN  Take 400 mg by mouth every evening.     gabapentin 300 MG capsule  Commonly known as:  NEURONTIN  Take 300 mg by mouth every morning.     hydrOXYzine 25 MG capsule  Commonly known as:  VISTARIL  Take 75 mg by mouth at bedtime.     ibuprofen 800 MG tablet  Commonly known as:  ADVIL,MOTRIN  Take 1 tablet (800 mg total) by mouth every 8 (eight) hours as needed (mild pain).     lisinopril-hydrochlorothiazide 20-25 MG per tablet  Commonly known as:  PRINZIDE,ZESTORETIC  Take 1 tablet by mouth daily.     multivitamin-prenatal 27-0.8 MG Tabs tablet  Take 1 tablet by mouth daily at 12 noon.     omeprazole 20 MG capsule  Commonly known as:  PRILOSEC  Take 20 mg by mouth daily.     oxyCODONE-acetaminophen 5-325 MG per tablet  Commonly known as:  PERCOCET/ROXICET  Take 1-2 tablets by mouth every 4 (four) hours as needed for severe pain (moderate to severe pain (when tolerating fluids)).     sertraline 100 MG tablet  Commonly known as:  ZOLOFT  Take 150 mg by mouth daily.     VITAMIN D PO  Take 1 tablet by mouth daily.           Follow-up Information   Follow up with Siraj Dermody C., MD. Schedule an appointment as soon as possible for a visit in 6 weeks.   Specialty:  Obstetrics and Gynecology   Contact information:   [address] [place] [address] [phone]       Signed: Emily Filbert 06/18/2014, 10:48 AM

## 2014-06-18 NOTE — Progress Notes (Signed)
Pt verbalizes understanding of d/c instructions, medications, follow up appts, when to seek medical attention and pt belongings policy. Pt was encouraged to check room thoroughly for belongings before leaving. NO IV at time of d/c. Pt has no questions at this time. Pt has d/c instructions, prescriptions, and "Recovering from your Surgery" booklet at this time. Pts sister is in the room and will be driving pt home. Pt d/c to main entrance accompanied by NT, via wheelchair. Marry Guan

## 2014-07-09 ENCOUNTER — Other Ambulatory Visit (INDEPENDENT_AMBULATORY_CARE_PROVIDER_SITE_OTHER): Payer: Medicaid Other | Admitting: *Deleted

## 2014-07-09 DIAGNOSIS — R350 Frequency of micturition: Secondary | ICD-10-CM

## 2014-07-09 LAB — POCT URINALYSIS DIPSTICK
BILIRUBIN UA: NEGATIVE
Glucose, UA: NEGATIVE
Ketones, UA: NEGATIVE
Leukocytes, UA: NEGATIVE
NITRITE UA: NEGATIVE
Protein, UA: NEGATIVE
RBC UA: NEGATIVE
Spec Grav, UA: >=1.03
Urobilinogen, UA: NEGATIVE
pH, UA: 6.5

## 2014-07-09 NOTE — Progress Notes (Signed)
Patient is having increased frequency of urination and some discomfort after urination.  Her urine dip is negative for infection but due to symptoms we will send for culture.

## 2014-07-11 LAB — URINE CULTURE
Colony Count: NO GROWTH
ORGANISM ID, BACTERIA: NO GROWTH

## 2014-07-21 ENCOUNTER — Ambulatory Visit (INDEPENDENT_AMBULATORY_CARE_PROVIDER_SITE_OTHER): Payer: Medicaid Other | Admitting: Family Medicine

## 2014-07-21 ENCOUNTER — Encounter: Payer: Self-pay | Admitting: Family Medicine

## 2014-07-21 VITALS — BP 154/84 | HR 77 | Wt 258.0 lb

## 2014-07-21 DIAGNOSIS — N898 Other specified noninflammatory disorders of vagina: Secondary | ICD-10-CM

## 2014-07-21 DIAGNOSIS — R309 Painful micturition, unspecified: Secondary | ICD-10-CM

## 2014-07-21 LAB — POCT URINALYSIS DIPSTICK
BILIRUBIN UA: NEGATIVE
Blood, UA: NEGATIVE
Glucose, UA: NEGATIVE
Ketones, UA: NEGATIVE
NITRITE UA: NEGATIVE
PH UA: 6
Spec Grav, UA: >=1.03
Urobilinogen, UA: NEGATIVE

## 2014-07-21 MED ORDER — FLUCONAZOLE 150 MG PO TABS: 150.0000 mg | ORAL_TABLET | Freq: Every day | ORAL | Status: DC

## 2014-07-21 NOTE — Progress Notes (Signed)
    Subjective:    Patient ID: Claire Green is a 48 y.o. female presenting with Vaginal Discharge  on 07/21/2014  HPI: S/p hysterectomy 4 wks ago.  Symptoms began after that.  Reports severe itching and burning with urination.  Notes white and brown discharge.  Some odor.  Review of Systems  Constitutional: Negative for fever and chills.  Gastrointestinal: Negative for nausea, vomiting and abdominal pain.  Genitourinary: Positive for vaginal discharge. Negative for vaginal bleeding, vaginal pain and menstrual problem.      Objective:    BP 154/84 mmHg  Pulse 77  Wt 258 lb (117.028 kg)  LMP 05/18/2014 Physical Exam  Constitutional: She is oriented to person, place, and time. She appears well-developed and well-nourished. No distress.  HENT:  Head: Normocephalic and atraumatic.  Eyes: No scleral icterus.  Neck: Neck supple.  Cardiovascular: Normal rate.   Pulmonary/Chest: Effort normal.  Abdominal: Soft.  Genitourinary:  White vaginal discharge  Neurological: She is alert and oriented to person, place, and time.  Skin: Skin is warm and dry.  Psychiatric: She has a normal mood and affect.        Assessment & Plan:    Pain with urination - Plan: POCT Urinalysis Dipstick  Vaginal discharge - Treat presumptively for yeast--check wet prep--adjust treatment as needed. - Plan: Wet prep, genital, fluconazole (DIFLUCAN) 150 MG tablet  Return in about 2 weeks (around 08/04/2014) for postop check.

## 2014-07-21 NOTE — Patient Instructions (Signed)

## 2014-07-22 LAB — WET PREP, GENITAL: Trich, Wet Prep: NONE SEEN

## 2014-07-28 ENCOUNTER — Ambulatory Visit (INDEPENDENT_AMBULATORY_CARE_PROVIDER_SITE_OTHER): Payer: Medicaid Other | Admitting: Obstetrics & Gynecology

## 2014-07-28 ENCOUNTER — Encounter: Payer: Self-pay | Admitting: Obstetrics & Gynecology

## 2014-07-28 VITALS — BP 124/87 | HR 73 | Wt 258.0 lb

## 2014-07-28 DIAGNOSIS — Z9889 Other specified postprocedural states: Secondary | ICD-10-CM

## 2014-07-28 DIAGNOSIS — Z09 Encounter for follow-up examination after completed treatment for conditions other than malignant neoplasm: Secondary | ICD-10-CM

## 2014-07-28 DIAGNOSIS — Z4889 Encounter for other specified surgical aftercare: Secondary | ICD-10-CM

## 2014-07-28 NOTE — Progress Notes (Signed)
   Subjective:    Patient ID: Claire Green Height, female    DOB: 1965-10-22, 48 y.o.   MRN: [id number]  HPI  48 yo AA lady is here 6 weeks post op status post TAH/RSO/LS for fibroids. She is doing well without complaints. She reports normal bowel and bladder function. She has not had sex yet.  Review of Systems     Objective:   Physical Exam  Well-healed incision Benign obese abdomen      Assessment & Plan:  Post op doing well She may return to normal activities. She is aware that she will still need pap smears RTC 1 year/prn sooner

## 2014-08-10 ENCOUNTER — Encounter: Payer: Self-pay | Admitting: *Deleted

## 2014-09-04 ENCOUNTER — Encounter: Payer: Self-pay | Admitting: *Deleted

## 2014-11-16 ENCOUNTER — Other Ambulatory Visit: Payer: Self-pay

## 2014-11-16 DIAGNOSIS — Z1231 Encounter for screening mammogram for malignant neoplasm of breast: Secondary | ICD-10-CM

## 2014-12-07 ENCOUNTER — Ambulatory Visit
Admission: RE | Admit: 2014-12-07 | Discharge: 2014-12-07 | Disposition: A | Discharge status: Home / Self Care | Payer: Medicaid Other | Source: Ambulatory Visit

## 2014-12-07 ENCOUNTER — Encounter (INDEPENDENT_AMBULATORY_CARE_PROVIDER_SITE_OTHER): Payer: Self-pay

## 2014-12-07 DIAGNOSIS — Z1231 Encounter for screening mammogram for malignant neoplasm of breast: Secondary | ICD-10-CM

## 2015-03-25 ENCOUNTER — Other Ambulatory Visit: Payer: Self-pay

## 2015-03-25 DIAGNOSIS — R599 Enlarged lymph nodes, unspecified: Secondary | ICD-10-CM

## 2015-03-25 DIAGNOSIS — R591 Generalized enlarged lymph nodes: Secondary | ICD-10-CM

## 2015-03-25 DIAGNOSIS — N611 Abscess of the breast and nipple: Secondary | ICD-10-CM

## 2015-03-26 ENCOUNTER — Ambulatory Visit
Admission: RE | Admit: 2015-03-26 | Discharge: 2015-03-26 | Disposition: A | Discharge status: Home / Self Care | Payer: Medicaid Other | Source: Ambulatory Visit | Attending: General Surgery | Admitting: General Surgery

## 2015-03-26 ENCOUNTER — Ambulatory Visit: Admission: RE | Admit: 2015-03-26 | Payer: Medicaid Other | Source: Ambulatory Visit

## 2015-04-01 ENCOUNTER — Encounter (HOSPITAL_COMMUNITY): Payer: Self-pay | Admitting: Emergency Medicine

## 2015-04-01 ENCOUNTER — Emergency Department (HOSPITAL_COMMUNITY)
Admission: EM | Admit: 2015-04-01 | Discharge: 2015-04-01 | Disposition: A | Discharge status: Home / Self Care | Payer: Medicaid Other | Source: Home / Self Care | Attending: Emergency Medicine | Admitting: Emergency Medicine

## 2015-04-01 DIAGNOSIS — T50905A Adverse effect of unspecified drugs, medicaments and biological substances, initial encounter: Secondary | ICD-10-CM

## 2015-04-01 DIAGNOSIS — L509 Urticaria, unspecified: Secondary | ICD-10-CM

## 2015-04-01 DIAGNOSIS — T887XXA Unspecified adverse effect of drug or medicament, initial encounter: Secondary | ICD-10-CM | POA: Diagnosis not present

## 2015-04-01 MED ORDER — PREDNISONE 50 MG PO TABS: ORAL_TABLET | ORAL | Status: DC

## 2015-04-01 NOTE — Discharge Instructions (Signed)
You are likely having a reaction to the Bactrim. Take prednisone daily for the next 5 days. Take Benadryl (diphenhydramine) 25 mg every 4-6 hours as needed for itching. Follow-up if no improvement in the next week.

## 2015-04-01 NOTE — ED Provider Notes (Signed)
CSN: [id number]     Arrival date & time 04/01/15  1841 History   First MD Initiated Contact with Patient 04/01/15 2036     Chief Complaint  Patient presents with  . Rash   (Consider location/radiation/quality/duration/timing/severity/associated sxs/prior Treatment) HPI  She is a 49 year old woman here for evaluation of rash. She states it started on her right posterior leg yesterday. It is very itchy. She states it has poped up on her arms and across her stomach.  She just finished a course of Bactrim yesterday. She had not previously taken Bactrim.  She has tried a cream without improvement. No oral lesions or fever.   Past Medical History  Diagnosis Date  . Hypertension   . Arthritis   . Anxiety   . Depression   . GERD (gastroesophageal reflux disease)   . Strep throat   . Bronchitis   . Arthritis, low back   . Morbid obesity   . Apnea, sleep     Does not use CPAP every night but some nights  . Anemia    Past Surgical History  Procedure Laterality Date  . C section  1982, 1988, 1989    x 3  . Breast surgery  1997    cyst removed bilat breast  . Tubal ligation  1989  . Wisdom tooth extraction    . Abdominal hysterectomy Bilateral 06/16/2014    Procedure: SUPERCERVICAL HYSTERECTOMY ABDOMINAL, LEFT SALPINGO-OPPHERECTOMY, AND RIGHT SALPINGECTOMY;  Surgeon: Emily Filbert, MD;  Location: [facility];  Service: Gynecology;  Laterality: Bilateral;  With bilateral salpingectomy   No family history on file. History  Substance Use Topics  . Smoking status: Never Smoker   . Smokeless tobacco: Never Used  . Alcohol Use: No   OB History    No data available     Review of Systems As in history of present illness Allergies  Bactrim and Penicillins  Home Medications   Prior to Admission medications   Medication Sig Start Date End Date Taking? Authorizing Provider  CALCIUM PO Take 1 tablet by mouth daily.    Historical Provider, MD  Cholecalciferol (VITAMIN D PO) Take 1 tablet by  mouth daily.    Historical Provider, MD  gabapentin (NEURONTIN) 300 MG capsule Take 300 mg by mouth every morning.     Historical Provider, MD  gabapentin (NEURONTIN) 400 MG capsule Take 400 mg by mouth every evening.    Historical Provider, MD  hydrOXYzine (VISTARIL) 25 MG capsule Take 75 mg by mouth at bedtime.    Historical Provider, MD  ibuprofen (ADVIL,MOTRIN) 800 MG tablet Take 1 tablet (800 mg total) by mouth every 8 (eight) hours as needed (mild pain). 06/18/14   Emily Filbert, MD  lisinopril-hydrochlorothiazide (PRINZIDE,ZESTORETIC) 20-25 MG per tablet Take 1 tablet by mouth daily.     Historical Provider, MD  omeprazole (PRILOSEC) 20 MG capsule Take 20 mg by mouth daily.    Historical Provider, MD  predniSONE (DELTASONE) 50 MG tablet Take 1 pill daily for 5 days. 04/01/15   Melony Overly, MD  Prenatal Vit-Fe Fumarate-FA (MULTIVITAMIN-PRENATAL) 27-0.8 MG TABS tablet Take 1 tablet by mouth daily at 12 noon.    Historical Provider, MD  sertraline (ZOLOFT) 100 MG tablet Take 150 mg by mouth daily.      Historical Provider, MD   BP 142/83 mmHg  Pulse 68  Temp(Src) 98.8 F (37.1 C) (Oral)  Resp 16  SpO2 99%  LMP 05/18/2014 Physical Exam  Constitutional: She is oriented to  person, place, and time. She appears well-developed and well-nourished. No distress.  Cardiovascular: Normal rate.   Pulmonary/Chest: Effort normal.  Neurological: She is alert and oriented to person, place, and time.  Skin:  She has erythematous wheals on the right posterior leg. She has a similar rash across her stomach and arms.    ED Course  Procedures (including critical care time) Labs Review Labs Reviewed - No data to display  Imaging Review No results found.   MDM   1. Hives   2. Drug reaction, initial encounter    I added Bactrim to her allergy list.  No fever or mucosal lesions to suggest Stevens-Johnsons. Prednisone for 5 days. Benadryl as needed for itching. Follow-up if no improvement in the  next 5 days.    Melony Overly, MD 04/01/15 2103

## 2015-04-01 NOTE — ED Notes (Signed)
Rash started on right lower leg yesterday.  Has spread to upper extremities and torso today and is itching.  Unknown cause.  Did finish bactrim script past night.  Was taking this after having cyst removed from breast.

## 2015-04-05 ENCOUNTER — Inpatient Hospital Stay: Admission: RE | Admit: 2015-04-05 | Payer: Medicaid Other | Source: Ambulatory Visit

## 2015-04-13 ENCOUNTER — Ambulatory Visit
Admission: RE | Admit: 2015-04-13 | Discharge: 2015-04-13 | Disposition: A | Discharge status: Home / Self Care | Payer: Medicaid Other | Source: Ambulatory Visit | Attending: General Surgery | Admitting: General Surgery

## 2015-04-13 DIAGNOSIS — N611 Abscess of the breast and nipple: Secondary | ICD-10-CM

## 2015-12-06 ENCOUNTER — Other Ambulatory Visit: Payer: Self-pay

## 2015-12-06 DIAGNOSIS — Z1231 Encounter for screening mammogram for malignant neoplasm of breast: Secondary | ICD-10-CM

## 2015-12-22 ENCOUNTER — Ambulatory Visit
Admission: RE | Admit: 2015-12-22 | Discharge: 2015-12-22 | Disposition: A | Discharge status: Home / Self Care | Payer: Medicaid Other | Source: Ambulatory Visit

## 2015-12-22 DIAGNOSIS — Z1231 Encounter for screening mammogram for malignant neoplasm of breast: Secondary | ICD-10-CM

## 2016-11-28 ENCOUNTER — Other Ambulatory Visit: Payer: Self-pay | Admitting: Nurse Practitioner

## 2016-11-28 DIAGNOSIS — Z1231 Encounter for screening mammogram for malignant neoplasm of breast: Secondary | ICD-10-CM

## 2016-12-22 ENCOUNTER — Ambulatory Visit
Admission: RE | Admit: 2016-12-22 | Discharge: 2016-12-22 | Disposition: A | Discharge status: Home / Self Care | Payer: Medicaid Other | Source: Ambulatory Visit | Attending: Nurse Practitioner | Admitting: Nurse Practitioner

## 2016-12-22 DIAGNOSIS — Z1231 Encounter for screening mammogram for malignant neoplasm of breast: Secondary | ICD-10-CM

## 2017-10-03 ENCOUNTER — Other Ambulatory Visit: Payer: Self-pay

## 2017-10-03 ENCOUNTER — Encounter (HOSPITAL_COMMUNITY): Payer: Self-pay | Admitting: Emergency Medicine

## 2017-10-03 ENCOUNTER — Ambulatory Visit (HOSPITAL_COMMUNITY)
Admission: EM | Admit: 2017-10-03 | Discharge: 2017-10-03 | Disposition: A | Discharge status: Home / Self Care | Payer: Medicaid Other | Attending: Family Medicine | Admitting: Family Medicine

## 2017-10-03 DIAGNOSIS — J029 Acute pharyngitis, unspecified: Secondary | ICD-10-CM

## 2017-10-03 DIAGNOSIS — J020 Streptococcal pharyngitis: Secondary | ICD-10-CM

## 2017-10-03 LAB — POCT RAPID STREP A: Streptococcus, Group A Screen (Direct): POSITIVE — AB

## 2017-10-03 MED ORDER — ACETAMINOPHEN 325 MG PO TABS
ORAL_TABLET | ORAL | Status: AC
  Filled 2017-10-03: qty 2

## 2017-10-03 MED ORDER — ACETAMINOPHEN 325 MG PO TABS
650.0000 mg | ORAL_TABLET | Freq: Once | ORAL | Status: AC
  Administered 2017-10-03: 650 mg via ORAL

## 2017-10-03 MED ORDER — AMOXICILLIN 875 MG PO TABS: 875.0000 mg | ORAL_TABLET | Freq: Two times a day (BID) | ORAL | 0 refills | Status: AC

## 2017-10-03 NOTE — ED Triage Notes (Signed)
Pt. Stated, I have a history of strep and Ive had sore throat for 3 days and my body hurts.

## 2017-10-04 NOTE — ED Provider Notes (Signed)
[place]   [id number] 10/03/17 Arrival Time: 1802  ASSESSMENT & PLAN:  1. Sore throat   2. Strep pharyngitis     Meds ordered this encounter  Medications  . acetaminophen (TYLENOL) tablet 650 mg  . amoxicillin (AMOXIL) 875 MG tablet    Sig: Take 1 tablet (875 mg total) by mouth 2 (two) times daily for 10 days.    Dispense:  20 tablet    Refill:  0    Results for orders placed or performed during the hospital encounter of 10/03/17  POCT rapid strep A [facility])  Result Value Ref Range   Streptococcus, Group A Screen (Direct) POSITIVE (A) NEGATIVE   OTC analgesics and throat care as needed  Instructed to finish full 10 day course of antibiotics. Will follow up if not showing significant improvement over the next 24-48 hours.  Reviewed expectations re: course of current medical issues. Questions answered. Outlined signs and symptoms indicating need for more acute intervention. Patient verbalized understanding. After Visit Summary given.   SUBJECTIVE:  Claire Green is a 52 y.o. female who reports a sore throat. Describes as sharp pain with swallowing. H/O strep throat. Onset abrupt beginning 3 days ago. No respiratory symptoms. Normal PO intake but reports discomfort with swallowing. Fever reported: yes. No associated n/v/abdominal symptoms. Sick contacts: none.  OTC treatment: ibuprofen with some relief.  ROS: As per HPI.   OBJECTIVE:  Vitals:   10/03/17 1910 10/03/17 1912  BP:  (!) 159/77  Pulse:  98  Resp:  18  Temp:  (!) 102.8 F (39.3 C)  TempSrc:  Oral  SpO2:  97%  Weight: 280 lb (127 kg)   Height: 5\' 5"  (1.651 m)      General appearance: alert; no distress HEENT: throat with tonsillar hypertrophy, moderate erythema and exudates present; uvula midline Neck: supple with FROM; bilateral cervical LAD, tender Lungs: clear to auscultation bilaterally Skin: reveals no rash; warm and dry Psychological: alert and cooperative; normal mood  and affect  Allergies  Allergen Reactions  . Bactrim [Sulfamethoxazole-Trimethoprim] Hives  . Penicillins Rash    Childhood reaction, and thinks she had hives as young adult Has taken amoxicillin without a problem    Past Medical History:  Diagnosis Date  . Anemia   . Anxiety   . Apnea, sleep    Does not use CPAP every night but some nights  . Arthritis   . Arthritis, low back ([place])   . Bronchitis   . Depression   . GERD (gastroesophageal reflux disease)   . Hypertension   . Morbid obesity ([place])   . Strep throat    Social History   Socioeconomic History  . Marital status: Single    Spouse name: Not on file  . Number of children: Not on file  . Years of education: Not on file  . Highest education level: Not on file  Social Needs  . Financial resource strain: Not on file  . Food insecurity - worry: Not on file  . Food insecurity - inability: Not on file  . Transportation needs - medical: Not on file  . Transportation needs - non-medical: Not on file  Occupational History  . Not on file  Tobacco Use  . Smoking status: Never Smoker  . Smokeless tobacco: Never Used  Substance and Sexual Activity  . Alcohol use: No  . Drug use: No  . Sexual activity: Yes    Partners: Male    Birth control/protection: Surgical  Comment: tubal ligation  Other Topics Concern  . Not on file  Social History Narrative  . Not on file   Family History  Problem Relation Age of Onset  . Breast cancer Neg Hx           Vanessa Kick, MD 10/04/17 [phone]

## 2017-12-31 ENCOUNTER — Other Ambulatory Visit: Payer: Self-pay | Admitting: Nurse Practitioner

## 2017-12-31 DIAGNOSIS — Z1231 Encounter for screening mammogram for malignant neoplasm of breast: Secondary | ICD-10-CM

## 2018-01-23 ENCOUNTER — Ambulatory Visit
Admission: RE | Admit: 2018-01-23 | Discharge: 2018-01-23 | Disposition: A | Discharge status: Home / Self Care | Payer: Medicaid Other | Source: Ambulatory Visit | Attending: Nurse Practitioner | Admitting: Nurse Practitioner

## 2018-01-23 DIAGNOSIS — Z1231 Encounter for screening mammogram for malignant neoplasm of breast: Secondary | ICD-10-CM

## 2018-06-04 ENCOUNTER — Encounter: Payer: Self-pay | Admitting: Internal Medicine

## 2018-07-08 ENCOUNTER — Ambulatory Visit (AMBULATORY_SURGERY_CENTER): Payer: Self-pay

## 2018-07-08 VITALS — Ht 65.0 in | Wt 288.4 lb

## 2018-07-08 DIAGNOSIS — Z1211 Encounter for screening for malignant neoplasm of colon: Secondary | ICD-10-CM

## 2018-07-08 MED ORDER — NA SULFATE-K SULFATE-MG SULF 17.5-3.13-1.6 GM/177ML PO SOLN: 1.0000 | Freq: Once | ORAL | 0 refills | Status: AC

## 2018-07-08 NOTE — Progress Notes (Signed)
Denies allergies to eggs or soy products. Denies complication of anesthesia or sedation. Denies use of weight loss medication. Denies use of O2.   Emmi instructions declined.  

## 2018-07-22 ENCOUNTER — Encounter: Payer: Self-pay | Admitting: Internal Medicine

## 2018-07-22 ENCOUNTER — Ambulatory Visit (AMBULATORY_SURGERY_CENTER): Payer: Medicaid Other | Admitting: Internal Medicine

## 2018-07-22 VITALS — BP 131/82 | HR 80 | Temp 97.5°F | Resp 13 | Ht 65.0 in | Wt 288.0 lb

## 2018-07-22 DIAGNOSIS — Z1211 Encounter for screening for malignant neoplasm of colon: Secondary | ICD-10-CM | POA: Diagnosis not present

## 2018-07-22 MED ORDER — SODIUM CHLORIDE 0.9 % IV SOLN: 500.0000 mL | Freq: Once | INTRAVENOUS | Status: AC

## 2018-07-22 NOTE — Op Note (Signed)
[facility] Patient Name: Claire Green Procedure Date: 07/22/2018 8:40 AM MRN: [id number] Endoscopist: Docia Chuck. Henrene Pastor , MD Age: 52 Referring MD:  Date of Birth: 06/03/1966 Gender: Female Account #: [id number] Procedure:                Colonoscopy Indications:              Screening for colorectal malignant neoplasm Medicines:                Monitored Anesthesia Care Procedure:                Pre-Anesthesia Assessment:                           - Prior to the procedure, a History and Physical                            was performed, and patient medications and                            allergies were reviewed. The patient's tolerance of                            previous anesthesia was also reviewed. The risks                            and benefits of the procedure and the sedation                            options and risks were discussed with the patient.                            All questions were answered, and informed consent                            was obtained. Prior Anticoagulants: The patient has                            taken no previous anticoagulant or antiplatelet                            agents. ASA Grade Assessment: II - A patient with                            mild systemic disease. After reviewing the risks                            and benefits, the patient was deemed in                            satisfactory condition to undergo the procedure.                           After obtaining informed consent, the colonoscope  was passed under direct vision. Throughout the                            procedure, the patient's blood pressure, pulse, and                            oxygen saturations were monitored continuously. The                            Model PCF-H190DL [id number]) scope was introduced                            through the anus and advanced to the the cecum,                            identified by  appendiceal orifice and ileocecal                            valve. The ileocecal valve, appendiceal orifice,                            and rectum were photographed. The quality of the                            bowel preparation was good. The colonoscopy was                            performed without difficulty. The patient tolerated                            the procedure well. The bowel preparation used was                            SUPREP. Scope In: 8:58:24 AM Scope Out: 9:12:52 AM Scope Withdrawal Time: 0 hours 10 minutes 53 seconds  Total Procedure Duration: 0 hours 14 minutes 28 seconds  Findings:                 The entire examined colon appeared normal on direct                            and retroflexion views. Complications:            No immediate complications. Estimated blood loss:                            None. Estimated Blood Loss:     Estimated blood loss: none. Impression:               - The entire examined colon is normal on direct and                            retroflexion views.                           - No specimens collected. Recommendation:           -  Repeat colonoscopy in 10 years for screening                            purposes.                           - Patient has a contact number available for                            emergencies. The signs and symptoms of potential                            delayed complications were discussed with the                            patient. Return to normal activities tomorrow.                            Written discharge instructions were provided to the                            patient.                           - Resume previous diet.                           - Continue present medications. Docia Chuck. Henrene Pastor, MD 07/22/2018 9:16:06 AM This report has been signed electronically.

## 2018-07-22 NOTE — Progress Notes (Signed)
Report to PACU, RN, vss, BBS= Clear.  

## 2018-07-22 NOTE — Progress Notes (Signed)
Pt's states no medical or surgical changes since previsit or office visit. 

## 2018-07-22 NOTE — Patient Instructions (Signed)
YOU HAD AN ENDOSCOPIC PROCEDURE TODAY AT THE [facility]:   Refer to the procedure report that was given to you for any specific questions about what was found during the examination.  If the procedure report does not answer your questions, please call your gastroenterologist to clarify.  If you requested that your care partner not be given the details of your procedure findings, then the procedure report has been included in a sealed envelope for you to review at your convenience later.  YOU SHOULD EXPECT: Some feelings of bloating in the abdomen. Passage of more gas than usual.  Walking can help get rid of the air that was put into your GI tract during the procedure and reduce the bloating. If you had a lower endoscopy (such as a colonoscopy or flexible sigmoidoscopy) you may notice spotting of blood in your stool or on the toilet paper. If you underwent a bowel prep for your procedure, you may not have a normal bowel movement for a few days.  Please Note:  You might notice some irritation and congestion in your nose or some drainage.  This is from the oxygen used during your procedure.  There is no need for concern and it should clear up in a day or so.  SYMPTOMS TO REPORT IMMEDIATELY:   Following lower endoscopy (colonoscopy or flexible sigmoidoscopy):  Excessive amounts of blood in the stool  Significant tenderness or worsening of abdominal pains  Swelling of the abdomen that is new, acute  Fever of 100F or higher  For urgent or emergent issues, a gastroenterologist can be reached at any hour by calling [phone].   DIET:  We do recommend a small meal at first, but then you may proceed to your regular diet.  Drink plenty of fluids but you should avoid alcoholic beverages for 24 hours.  MEDICATIONS:  Continue present medications.  ACTIVITY:  You should plan to take it easy for the rest of today and you should NOT DRIVE or use heavy machinery until tomorrow (because of the  sedation medicines used during the test).    FOLLOW UP: Our staff will call the number listed on your records the next business day following your procedure to check on you and address any questions or concerns that you may have regarding the information given to you following your procedure. If we do not reach you, we will leave a message.  However, if you are feeling well and you are not experiencing any problems, there is no need to return our call.  We will assume that you have returned to your regular daily activities without incident.  If any biopsies were taken you will be contacted by phone or by letter within the next 1-3 weeks.  Please call us at [phone] if you have not heard about the biopsies in 3 weeks.   Thank you for allowing us to provide for your healthcare needs today.   SIGNATURES/CONFIDENTIALITY: You and/or your care partner have signed paperwork which will be entered into your electronic medical record.  These signatures attest to the fact that that the information above on your After Visit Summary has been reviewed and is understood.  Full responsibility of the confidentiality of this discharge information lies with you and/or your care-partner. 

## 2018-07-23 ENCOUNTER — Telehealth: Payer: Self-pay

## 2018-07-23 ENCOUNTER — Telehealth: Payer: Self-pay | Admitting: *Deleted

## 2018-07-23 NOTE — Telephone Encounter (Signed)
NO ANSWER, MESSAGE LEFT FOR PATIENT. 

## 2018-07-23 NOTE — Telephone Encounter (Signed)
  Follow up Call-  Call back number 07/22/2018  Post procedure Call Back phone  # [phone]  Permission to leave phone message Yes  Some recent data might be hidden     Patient questions:  Do you have a fever, pain , or abdominal swelling? No. Pain Score  0 *  Have you tolerated food without any problems? Yes.    Have you been able to return to your normal activities? Yes.    Do you have any questions about your discharge instructions: Diet   No. Medications  No. Follow up visit  No.  Do you have questions or concerns about your Care? No.  Actions: * If pain score is 4 or above: No action needed, pain <4.

## 2019-01-13 ENCOUNTER — Other Ambulatory Visit: Payer: Self-pay | Admitting: Nurse Practitioner

## 2019-01-13 DIAGNOSIS — Z1231 Encounter for screening mammogram for malignant neoplasm of breast: Secondary | ICD-10-CM

## 2019-02-08 ENCOUNTER — Ambulatory Visit
Admission: RE | Admit: 2019-02-08 | Discharge: 2019-02-08 | Disposition: A | Discharge status: Home / Self Care | Payer: Medicaid Other | Source: Ambulatory Visit | Attending: Nurse Practitioner | Admitting: Nurse Practitioner

## 2019-02-08 ENCOUNTER — Other Ambulatory Visit: Payer: Self-pay

## 2019-02-08 DIAGNOSIS — Z1231 Encounter for screening mammogram for malignant neoplasm of breast: Secondary | ICD-10-CM

## 2019-06-23 ENCOUNTER — Ambulatory Visit: Payer: Medicaid Other | Admitting: Obstetrics & Gynecology

## 2019-06-23 NOTE — Progress Notes (Deleted)
Mammo needed Flu Shot? Pap smear?

## 2019-07-01 ENCOUNTER — Ambulatory Visit: Payer: Medicaid Other | Admitting: Obstetrics & Gynecology

## 2019-07-15 ENCOUNTER — Other Ambulatory Visit (HOSPITAL_COMMUNITY)
Admission: RE | Admit: 2019-07-15 | Discharge: 2019-07-15 | Disposition: A | Discharge status: Home / Self Care | Payer: Medicaid Other | Source: Ambulatory Visit | Attending: Obstetrics & Gynecology | Admitting: Obstetrics & Gynecology

## 2019-07-15 ENCOUNTER — Encounter: Payer: Self-pay | Admitting: Obstetrics & Gynecology

## 2019-07-15 ENCOUNTER — Other Ambulatory Visit: Payer: Self-pay

## 2019-07-15 ENCOUNTER — Ambulatory Visit (INDEPENDENT_AMBULATORY_CARE_PROVIDER_SITE_OTHER): Payer: Medicaid Other | Admitting: Obstetrics & Gynecology

## 2019-07-15 VITALS — BP 139/82 | HR 71 | Wt 285.2 lb

## 2019-07-15 DIAGNOSIS — R102 Pelvic and perineal pain: Secondary | ICD-10-CM

## 2019-07-15 DIAGNOSIS — Z01419 Encounter for gynecological examination (general) (routine) without abnormal findings: Secondary | ICD-10-CM | POA: Insufficient documentation

## 2019-07-15 DIAGNOSIS — F329 Major depressive disorder, single episode, unspecified: Secondary | ICD-10-CM | POA: Diagnosis not present

## 2019-07-15 DIAGNOSIS — F32A Depression, unspecified: Secondary | ICD-10-CM

## 2019-07-15 DIAGNOSIS — Z0000 Encounter for general adult medical examination without abnormal findings: Secondary | ICD-10-CM | POA: Diagnosis not present
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
## 2019-07-15 NOTE — Progress Notes (Signed)
Pt. States that she has a "pulling feeling like a rubber band" in her left ovary. This has been going on for the last year on and off. Pt. States it is not painful, just an uncomfortable feeling.

## 2019-07-21 LAB — CYTOLOGY - PAP
Adequacy: ABSENT
Comment: NEGATIVE
Diagnosis: NEGATIVE
High risk HPV: NEGATIVE
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
## 2019-07-22 ENCOUNTER — Ambulatory Visit (INDEPENDENT_AMBULATORY_CARE_PROVIDER_SITE_OTHER): Payer: Medicaid Other | Admitting: Clinical

## 2019-07-22 ENCOUNTER — Other Ambulatory Visit: Payer: Self-pay

## 2019-07-22 DIAGNOSIS — F419 Anxiety disorder, unspecified: Secondary | ICD-10-CM | POA: Diagnosis not present

## 2019-07-23 ENCOUNTER — Ambulatory Visit (HOSPITAL_COMMUNITY)
Admission: RE | Admit: 2019-07-23 | Discharge: 2019-07-23 | Disposition: A | Discharge status: Home / Self Care | Payer: Medicaid Other | Source: Ambulatory Visit | Attending: Obstetrics & Gynecology | Admitting: Obstetrics & Gynecology

## 2019-07-23 ENCOUNTER — Other Ambulatory Visit: Payer: Self-pay

## 2019-07-23 DIAGNOSIS — R102 Pelvic and perineal pain: Secondary | ICD-10-CM

## 2019-07-28 ENCOUNTER — Institutional Professional Consult (permissible substitution): Payer: Medicaid Other

## 2019-08-04 ENCOUNTER — Telehealth (INDEPENDENT_AMBULATORY_CARE_PROVIDER_SITE_OTHER): Payer: Medicaid Other | Admitting: Obstetrics & Gynecology

## 2019-08-04 ENCOUNTER — Encounter: Payer: Self-pay | Admitting: Obstetrics & Gynecology

## 2019-08-04 DIAGNOSIS — R102 Pelvic and perineal pain: Secondary | ICD-10-CM

## 2019-08-04 NOTE — Progress Notes (Signed)
TELEHEALTH GYNECOLOGY VIRTUAL VIDEO VISIT ENCOUNTER NOTE  Provider location: [facility] at [place]   I connected with Claire Green on 08/04/19 at 10:00 AM EST by MyChart Video Encounter at home and verified that I am speaking with the correct person using two identifiers.   I discussed the limitations, risks, security and privacy concerns of performing an evaluation and management service virtually and the availability of in person appointments. I also discussed with the patient that there may be a patient responsible charge related to this service. The patient expressed understanding and agreed to proceed.   History:  Claire Green is a 53 y.o. G3P3 female being evaluated today for right lower "pulling". She had an u/s done last month.      Past Medical History:  Diagnosis Date  . Anemia   . Anxiety   . Apnea, sleep    Does not use CPAP every night but some nights  . Arthritis   . Arthritis, low back   . Bronchitis   . Depression   . GERD (gastroesophageal reflux disease)   . Hyperlipidemia   . Hypertension   . Morbid obesity ([place])   . Sleep apnea    On CPAP  . Strep throat    Past Surgical History:  Procedure Laterality Date  . ABDOMINAL HYSTERECTOMY Bilateral 06/16/2014   Procedure: SUPERCERVICAL HYSTERECTOMY ABDOMINAL, LEFT SALPINGO-OPPHERECTOMY, AND RIGHT SALPINGECTOMY;  Surgeon: Emily Filbert, MD;  Location: [facility];  Service: Gynecology;  Laterality: Bilateral;  With bilateral salpingectomy  . BREAST EXCISIONAL BIOPSY Bilateral 1997  . BREAST SURGERY  1997   cyst removed bilat breast  . c section  1982, 1988, 1989   x [place]  . Upper teeth removed    . WISDOM TOOTH EXTRACTION     The following portions of the patient's history were reviewed and updated as appropriate: allergies, current medications, past family history, past medical history, past social history, past surgical history and problem list.    Review of  Systems:  Pertinent items noted in HPI and remainder of comprehensive ROS otherwise negative.  Physical Exam:   General:  Alert, oriented and cooperative. Patient appears to be in no acute distress.  Mental Status: Normal mood and affect. Normal behavior. Normal judgment and thought content.   Respiratory: Normal respiratory effort, no problems with respiration noted  Rest of physical exam deferred due to type of encounter  Labs and Imaging No results found for this or any previous visit (from the past 336 hour(s)). [place] [facility] Transvaginal  Result Date: 07/23/2019 CLINICAL DATA:  LEFT pelvic pain in a female, interval supracervical abdominal hysterectomy EXAM: ULTRASOUND PELVIS TRANSVAGINAL TECHNIQUE: Transvaginal ultrasound examination of the pelvis was performed including evaluation of the uterus, ovaries, adnexal regions, and pelvic cul-de-sac. COMPARISON:  05/19/2014 FINDINGS: Uterus Surgically absent Endometrium N/A Right ovary Measurements: 3.3 x 3.0 x 1.8 cm = volume: 9.1 mL. Dominant follicle without mass Left ovary Surgically absent Other findings: No free fluid or pelvic mass identified. Visualized urinary bladder and cervix unremarkable. IMPRESSION: Surgical absence of uterus and LEFT ovary. Unremarkable RIGHT ovary. No pelvic sonographic abnormalities identified. Electronically Signed   By: Lavonia Dana M.D.   On: 07/23/2019 10:02       Assessment and Plan:     RLQ discomfort- normal u/s She cannot take IBU with her arthritis med voltaren so I rec'd that she start taking tylenol 650 mg q8 hours RTC and  see if that improves her discomfort.  If this does not improve her discomfort (She insists that this is not "pain"), then I would rec a referral to GI or gen surgery or PT.  She has had a normal colonoscopy in the last year.      I discussed the assessment and treatment plan with the patient. The patient was provided an opportunity to ask questions and all were answered. The patient  agreed with the plan and demonstrated an understanding of the instructions.   The patient was advised to call back or seek an in-person evaluation/go to the ED if the symptoms worsen or if the condition fails to improve as anticipated.  I provided 10 minutes of face-to-face time during this encounter.   Emily Filbert, MD [facility], [place]

## 2019-08-04 NOTE — BH Specialist Note (Signed)
Integrated Behavioral Health via Telemedicine Video Visit  08/04/2019 [name] [id number]  Number of Claire Green visits: 2 Session Start time: 8:46  Session End time: 9:10 Total time: 24  Referring Provider: Clovia Cuff, MD Type of Visit: Video Patient/Family location: Home [name] Provider location: [facility] All persons participating in visit: [name] and [place]    Confirmed patient's address: Yes  Confirmed patient's phone number: Yes  Any changes to demographics: No   Confirmed patient's insurance: Yes  Any changes to patient's insurance: No   Discussed confidentiality: At previous visit  I connected with Claire Green by a video enabled telemedicine application and verified that I am speaking with the correct person using two identifiers.     I discussed the limitations of evaluation and management by telemedicine and the availability of in person appointments.  I discussed that the purpose of this visit is to provide behavioral health care while limiting exposure to the novel coronavirus.   Discussed there is a possibility of technology failure and discussed alternative modes of communication if that failure occurs.  I discussed that engaging in this video visit, they consent to the provision of behavioral healthcare and the services will be billed under their insurance.  Patient and/or legal guardian expressed understanding and consented to video visit: Yes   PRESENTING CONCERNS: Patient and/or family reports the following symptoms/concerns: Pt states she used the relaxation breathing exercise prior to having an ultrasound, which was helpful in preventing a panic attack; is still taking medication as prescribed, and would consider finding out options for ongoing therapy. Pt attributes some symptoms of depression and anxiety to escalating knee pain, for which she is currently being treated.  Duration of problem: Ongoing; Severity of  problem: severe  STRENGTHS (Protective Factors/Coping Skills): [employer]; open to treatment   GOALS ADDRESSED: Patient will: 1.  Reduce symptoms of: anxiety, depression and panic  2.  Increase knowledge and/or ability of: healthy habits  3.  Demonstrate ability to: Increase healthy adjustment to current life circumstances and Increase motivation to adhere to plan of care  INTERVENTIONS: Interventions utilized:  Solution-Focused Strategies, Psychoeducation and/or Health Education and Link to [employer] Standardized Assessments completed: GAD-7 and PHQ 9  ASSESSMENT: Patient currently experiencing Anxiety disorder, unspecified .   Patient may benefit from continued brief therapeutic interventions regarding coping with symptoms of depression and anxiety .  PLAN: 1. Follow up with behavioral health clinician on : As needed.  2. Behavioral recommendations:  -Continue taking BH medication as prescribed at [facility] -Consider options for ongoing therapy, sent via [facility] -Consider using relaxation breathing exercise twice daily (morning; at bedtime) -Research for the week: foods that increase inflammation in the body vs foods that decrease inflammation in the body. Consider replacing foods that increases inflammation with foods that decrease inflammation.  3. Referral(s): Integrated Orthoptist (In Clinic) and [employer] Resources:  Therapy options  I discussed the assessment and treatment plan with the patient and/or parent/guardian. They were provided an opportunity to ask questions and all were answered. They agreed with the plan and demonstrated an understanding of the instructions.   They were advised to call back or seek an in-person evaluation if the symptoms worsen or if the condition fails to improve as anticipated.  Caroleen Hamman McMannes  Depression screen [facility] 2/9 08/05/2019 08/05/2019 07/22/2019  Decreased Interest - 3 3  Down, Depressed, Hopeless - 3 1  PHQ - 2  Score - 6 4  Altered  sleeping - 1 1  Tired, decreased energy - 3 3  Change in appetite - 3 3  Feeling bad or failure about yourself  - 3 3  Trouble concentrating - 1 3  Moving slowly or fidgety/restless - 0 0  Suicidal thoughts 1 0 0  PHQ-9 Score - 17 17   GAD 7 : Generalized Anxiety Score 08/05/2019 07/22/2019  Nervous, Anxious, on Edge 1 2  Control/stop worrying 3 3  Worry too much - different things 3 3  Trouble relaxing 1 1  Restless 1 1  Easily annoyed or irritable 3 3  Afraid - awful might happen 3 3  Total GAD 7 Score 15 16

## 2019-08-05 ENCOUNTER — Ambulatory Visit (INDEPENDENT_AMBULATORY_CARE_PROVIDER_SITE_OTHER): Payer: Medicaid Other | Admitting: Clinical

## 2019-08-05 ENCOUNTER — Other Ambulatory Visit: Payer: Self-pay

## 2019-08-05 DIAGNOSIS — F419 Anxiety disorder, unspecified: Secondary | ICD-10-CM

## 2019-08-06 ENCOUNTER — Telehealth: Payer: Self-pay | Admitting: Clinical

## 2019-08-06 NOTE — Telephone Encounter (Signed)
Call to pt to clarify misunderstanding. Pt is given [facility] direct number, to call and schedule follow up appointments as needed in the future, and is aware follow up visits with [facility] in the [facility] clinic are shorter, 30 minute visits, rather than traditional, 60-minute routine therapy visits. Pt says she understands, and will utilize integrated behavioral health services, as needed.

## 2020-01-27 ENCOUNTER — Other Ambulatory Visit: Payer: Self-pay | Admitting: Nurse Practitioner

## 2020-01-27 DIAGNOSIS — Z1231 Encounter for screening mammogram for malignant neoplasm of breast: Secondary | ICD-10-CM

## 2020-02-02 ENCOUNTER — Other Ambulatory Visit: Payer: Self-pay

## 2020-02-02 ENCOUNTER — Ambulatory Visit: Payer: Medicaid Other | Attending: Anesthesiology | Admitting: Physical Therapy

## 2020-02-02 ENCOUNTER — Encounter: Payer: Self-pay | Admitting: Physical Therapy

## 2020-02-02 DIAGNOSIS — R293 Abnormal posture: Secondary | ICD-10-CM | POA: Diagnosis present

## 2020-02-02 DIAGNOSIS — M545 Low back pain, unspecified: Secondary | ICD-10-CM

## 2020-02-02 DIAGNOSIS — G8929 Other chronic pain: Secondary | ICD-10-CM | POA: Diagnosis present

## 2020-02-02 DIAGNOSIS — M25552 Pain in left hip: Secondary | ICD-10-CM | POA: Diagnosis present

## 2020-02-02 DIAGNOSIS — M6281 Muscle weakness (generalized): Secondary | ICD-10-CM | POA: Diagnosis present

## 2020-02-02 DIAGNOSIS — M25551 Pain in right hip: Secondary | ICD-10-CM | POA: Insufficient documentation

## 2020-02-02 NOTE — Patient Instructions (Signed)
Access Code: [email or web address] URL: [email or web address] Date: 02/02/2020 Prepared by: Estill Bamberg April Thurnell Garbe  Exercises Supine Bridge - 1 x daily - 7 x weekly - 2 sets - 10 reps - 2 hold Seated Child's Pose with Table - 1 x daily - 7 x weekly - 2 sets - 30 sec hold Seated Hamstring Stretch - 1 x daily - 7 x weekly - 2 sets - 30 sec hold Supine Piriformis Stretch with Foot on Ground - 1 x daily - 7 x weekly - 2 sets - 30 sec hold

## 2020-02-02 NOTE — Therapy (Signed)
[place], [place], [address] Phone: [phone]   Fax:  [phone]  Physical Therapy Evaluation  Patient Details  Name: Claire Green MRN: [id number] Date of Birth: 03-05-1966 Referring Provider (PT): Dorene Ar, MD   Encounter Date: 02/02/2020  PT End of Session - 02/02/20 1049    Visit Number  1    Number of Visits  4    Authorization Type  MCD -- pending auth    Authorization - Visit Number  1    Progress Note Due on Visit  4    PT Start Time  8786    PT Stop Time  1140    PT Time Calculation (min)  49 min    Activity Tolerance  Patient limited by pain   Increased pain in hip and knee after session   Behavior During Therapy  [facility] for tasks assessed/performed       Past Medical History:  Diagnosis Date  . Anemia   . Anxiety   . Apnea, sleep    Does not use CPAP every night but some nights  . Arthritis   . Arthritis, low back   . Bronchitis   . Depression   . GERD (gastroesophageal reflux disease)   . Hyperlipidemia   . Hypertension   . Morbid obesity ([place])   . Sleep apnea    On CPAP  . Strep throat     Past Surgical History:  Procedure Laterality Date  . ABDOMINAL HYSTERECTOMY Bilateral 06/16/2014   Procedure: SUPERCERVICAL HYSTERECTOMY ABDOMINAL, LEFT SALPINGO-OPPHERECTOMY, AND RIGHT SALPINGECTOMY;  Surgeon: Emily Filbert, MD;  Location: [facility];  Service: Gynecology;  Laterality: Bilateral;  With bilateral salpingectomy  . BREAST EXCISIONAL BIOPSY Bilateral 1997  . BREAST SURGERY  1997   cyst removed bilat breast  . c section  1982, 1988, 1989   x [place]  . Upper teeth removed    . WISDOM TOOTH EXTRACTION      There were no vitals filed for this visit.   Subjective Assessment - 02/02/20 1053    Subjective  Pt reports a lot of back pain (R worse than L). Pt states that the pain was going down the leg but then steroids helped ease that. Pt is on SSI so  mostly at home. Pt states she cleans, babysits,and watches movies. Pt states at worst 10/10 pain. Stairs are the biggest aggravating factor.    Limitations  House hold activities;Other (comment)   Stair climbing   Diagnostic tests  MRI 11/12/19: 1. 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 There is an L5-S1 disc bulge with small central protrusion. 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    Patient Stated Goals  Reduce pain    Currently in Pain?  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  2   Reports falling down the steps   Has the patient had a decrease in activity level because of a fear of falling?   No    Is the patient reluctant to leave their home because of a fear of falling?   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      Observation/Other Assessments   Observations  bilat foot edema    Focus on Therapeutic Outcomes (FOTO)   62%       Posture/Postural Control   Posture/Postural Control  Postural limitations    Postural Limitations  Increased lumbar lordosis;Rounded Shoulders      AROM   Lumbar Flexion  ~90% limited    also limited due to knee pain   Lumbar Extension  ~80% limited    Lumbar - Right Side Bend  ~25% limited    Lumbar - Left Side Bend  ~25% limited    Lumbar - Right Rotation  ~40% limited    Lumbar - Left Rotation  ~30% limited      Strength   Right Hip Flexion  3+/5    Right Hip Extension  4-/5    Right Hip ABduction  3+/5    Left Hip Flexion  4/5    Left Hip Extension  4-/5    Left Hip ABduction  4/5      Flexibility   Hamstrings  120 deg SLR on L, 160 deg SLR on R    Quadriceps  120 deg bilaterally   Prone     Palpation   Palpation comment  TTP bilat thoracolumbar paraspinals (L worse than R), TTP R piriformis      FABER test   findings  Positive    Side  Right    Comment  --   hip pain     Slump test   Findings  Negative      Prone Knee Bend Test   Findings  Negative    Comment  Tight quads bilaterally      Straight Leg Raise   Findings  Positive    Side   Right    Comment  Hip pain                  Objective measurements completed on examination: See above findings.      Real Adult PT Treatment/Exercise - 02/02/20 0001      Lumbar Exercises: Stretches   Active Hamstring Stretch  Right;Left;30 seconds   In seated   Piriformis Stretch  Right;Left;30 seconds   In supine   Other Lumbar Stretch Exercise  Child's pose in seated, forward, lateral bend L &  R x 30 sec each      Lumbar Exercises: Supine   Ab Set  5 reps    Bridge  5 reps;2 seconds    Bridge Limitations  Reports knee pain               PT Short Term Goals - 02/02/20 1212      PT SHORT TERM GOAL #1   Title  Pt will report worst pain to be <8/10    Baseline  10/10 pain at it's worst    Time  3    Period  Weeks    Status  New    Target Date  02/23/20      PT SHORT TERM GOAL  #2   Title  Pt will have improved bilat hamstring extensibilty by 10 deg for improved function with daily tasks    Baseline  Currently 160 deg on R, 120 deg on L in SLR    Time  3    Period  Weeks    Status  New    Target Date  02/23/20      PT SHORT TERM GOAL #3   Title  Pt will have improved lumbar ROM by ~10% for improved function with daily tasks    Baseline  Lumbar flexion limited ~90% due to pain, extension limited ~80% due to pain    Time  3    Period  Weeks    Status  New    Target Date  02/23/20      PT SHORT TERM GOAL #4   Title  Pt will be independent with her stretching exercises in her HEP    Baseline  Limited understanding with new stretches    Time  3    Period  Weeks    Status  New    Target Date  02/23/20      PT SHORT TERM GOAL #5   Title  Pt will improve bilat hip abduction and extension to at least 4+/5    Baseline  R hip abduction 3+/5, bilat hip extension 4-/5    Time  3    Period  Weeks    Status  New    Target Date  02/23/20        PT Long Term Goals - 02/02/20 1213      PT LONG TERM GOAL #1   Title  Pt will report worst pain to be <6/10 and </= 1/10 pain at rest    Time  8    Period  Weeks    Status  New    Target Date  03/29/20      PT LONG TERM GOAL #2   Title  Pt will be independent with HEP    Time  8    Period  Weeks    Status  New    Target Date  03/29/20      PT LONG TERM GOAL #3   Title  Pt will improve lumbar ROM to at least <50% limitation in all directions    Baseline  Flexion limited ~90%, extension limited ~80%    Time  8    Period  Weeks    Status  New    Target Date  03/29/20      PT LONG TERM GOAL #4   Title  Pt will improve bilat hip strength to grossly 5/5 throughout    Baseline  R hip abduction 3+/5, bilat extension 4/5    Time  8  Period  Weeks    Status  New    Target Date  03/29/20      PT LONG TERM GOAL #5   Title  Pt will improve FOTO score to </=57% limitation    Baseline  FOTO score: 62%  limitation    Time  8    Period  Weeks    Status  New    Target Date  03/29/20             Plan - 02/02/20 1151    Clinical Impression Statement  Pt is a 54 y/o F presenting to OPPT with c/o bilateral back pain (R worse than L). Pt with hx of chronic knee pain/OA. Pt demonstrates pain in lumbar region and bilat hip, decreased ROM, high paraspinal tone, decreased hip and core strength with decreased hamstring (R worse than L) and piriformis (L worse than R) extensibility affecting house work and home negotiation. Pt would benefit from therapy to address these issues. Recommend 2x/wk; however, pt states she is only able to come 1x/wk    Personal Factors and Comorbidities  Age;Fitness;Comorbidity 1;Comorbidity 2;Time since onset of injury/illness/exacerbation    Comorbidities  OA, obesity, chronic bilat LE edema    Examination-Activity Limitations  Bend;Stairs    Examination-Participation Restrictions  Community Activity;Laundry;Cleaning    Stability/Clinical Decision Making  Evolving/Moderate complexity    Clinical Decision Making  Moderate    Rehab Potential  Good    PT Frequency  1x / week    PT Duration  6 weeks    PT Treatment/Interventions  ADLs/Self Care Home Management;Electrical Stimulation;Cryotherapy;Iontophoresis 4mg /ml Dexamethasone;Moist Heat;Ultrasound;Traction;Stair training;Functional mobility training;Therapeutic activities;Therapeutic exercise;Balance training;Neuromuscular re-education;Patient/family education;Manual techniques;Passive range of motion;Taping    PT Next Visit Plan  Assess response to HEP, modify as needed. Continue stretching and strengthening core/hip as tolerated. Consider manual therapy and e-stim for pain modulation.    PT Home Exercise Plan  [id number]    Consulted and Agree with Plan of Care  Patient       Patient will benefit from skilled therapeutic intervention in order to improve the following deficits and impairments:  Decreased range of  motion, Increased fascial restricitons, Impaired tone, Obesity, Decreased activity tolerance, Decreased endurance, Pain, Hypomobility, Impaired flexibility, Improper body mechanics, Decreased mobility, Decreased strength, Increased edema, Postural dysfunction  Visit Diagnosis: Chronic bilateral low back pain without sciatica  Muscle weakness (generalized)  Pain in left hip  Pain in right hip  Abnormal posture     Problem List Patient Active Problem List   Diagnosis Date Noted  . Post-operative state 06/16/2014  . Obstructive apnea 03/20/2013  . 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 April Gordy Levan PT, DPT 02/02/2020, 12:33 PM  [facility] [address] [place], [place], [address] Phone: [phone]   Fax:  [phone]  Name: Claire Green MRN: [id number] Date of Birth: 03-13-1966

## 2020-02-09 ENCOUNTER — Ambulatory Visit
Admission: RE | Admit: 2020-02-09 | Discharge: 2020-02-09 | Disposition: A | Discharge status: Home / Self Care | Payer: Medicaid Other | Source: Ambulatory Visit | Attending: Nurse Practitioner | Admitting: Nurse Practitioner

## 2020-02-09 ENCOUNTER — Other Ambulatory Visit: Payer: Self-pay

## 2020-02-09 DIAGNOSIS — Z1231 Encounter for screening mammogram for malignant neoplasm of breast: Secondary | ICD-10-CM

## 2020-02-10 ENCOUNTER — Ambulatory Visit: Payer: Medicaid Other

## 2020-02-12 ENCOUNTER — Ambulatory Visit: Payer: Medicaid Other | Admitting: Physical Therapy

## 2020-02-12 ENCOUNTER — Other Ambulatory Visit: Payer: Self-pay

## 2020-02-12 DIAGNOSIS — G8929 Other chronic pain: Secondary | ICD-10-CM

## 2020-02-12 DIAGNOSIS — M6281 Muscle weakness (generalized): Secondary | ICD-10-CM

## 2020-02-12 DIAGNOSIS — M25551 Pain in right hip: Secondary | ICD-10-CM

## 2020-02-12 DIAGNOSIS — M25552 Pain in left hip: Secondary | ICD-10-CM

## 2020-02-12 DIAGNOSIS — M545 Low back pain: Secondary | ICD-10-CM | POA: Diagnosis not present

## 2020-02-12 DIAGNOSIS — R293 Abnormal posture: Secondary | ICD-10-CM

## 2020-02-12 NOTE — Therapy (Signed)
[place], [place], [address] Phone: [phone]   Fax:  [phone]  Physical Therapy Treatment  Patient Details  Name: Claire Green MRN: [id number] Date of Birth: 1966-05-22 Referring Provider (PT): Dorene Ar, MD   Encounter Date: 02/12/2020   PT End of Session - 02/12/20 1709    Visit Number 2    Number of Visits 4    Authorization Type MCD -- pending auth    Authorization - Visit Number 2    Authorization - Number of Visits 4    Progress Note Due on Visit --    PT Start Time 1703    PT Stop Time 1750    PT Time Calculation (min) 47 min    Activity Tolerance Patient limited by pain   Increased pain in hip and knee after session   Behavior During Therapy [facility] for tasks assessed/performed           Past Medical History:  Diagnosis Date  . Anemia   . Anxiety   . Apnea, sleep    Does not use CPAP every night but some nights  . Arthritis   . Arthritis, low back   . Bronchitis   . Depression   . GERD (gastroesophageal reflux disease)   . Hyperlipidemia   . Hypertension   . Morbid obesity ([place])   . Sleep apnea    On CPAP  . Strep throat     Past Surgical History:  Procedure Laterality Date  . ABDOMINAL HYSTERECTOMY Bilateral 06/16/2014   Procedure: SUPERCERVICAL HYSTERECTOMY ABDOMINAL, LEFT SALPINGO-OPPHERECTOMY, AND RIGHT SALPINGECTOMY;  Surgeon: Emily Filbert, MD;  Location: [facility];  Service: Gynecology;  Laterality: Bilateral;  With bilateral salpingectomy  . BREAST EXCISIONAL BIOPSY Bilateral 1997  . BREAST SURGERY  1997   cyst removed bilat breast  . c section  1982, 1988, 1989   x [place]  . Upper teeth removed    . WISDOM TOOTH EXTRACTION      There were no vitals filed for this visit.   Subjective Assessment - 02/12/20 1706    Subjective Pt states that she feels that back pain has remained the same. Pt notes that pain will intermittently go in lumbar  region to thoracic/cervical spine. She notes that the back of her right leg has been hurting. She states she has been doing the stretches but some of the stretches hurts. Pt reports no current low back pain and mostly tightness along thoracolumbar region.    Limitations House hold activities;Other (comment)    Diagnostic tests MRI 11/12/19: 1. 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 There is an L5-S1 disc bulge with small central protrusion. 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 Yes    Pain Score 5     Pain Location Leg    Pain Orientation Right    Pain Descriptors / Indicators Other (Comment);Sharp   Pulling   Pain Type Acute pain    Aggravating Factors  When walking or placing weight                             [facility] Adult PT Treatment/Exercise - 02/12/20 0001      Lumbar Exercises: Stretches   Active Hamstring Stretch Right;Left;30 seconds    Piriformis Stretch Right;Left;30  seconds      Lumbar Exercises: Aerobic   Nustep L5 x 5 min      Lumbar Exercises: Seated   Other Seated Lumbar Exercises Shoulder retractions x 10 reps    Other Seated Lumbar Exercises Cervical retractions x 10 reps      Lumbar Exercises: Prone   Straight Leg Raise 10 reps      Knee/Hip Exercises: Stretches   Passive Hamstring Stretch Both;30 seconds    Quad Stretch Both;30 seconds      Knee/Hip Exercises: Standing   Hip Abduction Stengthening;10 reps;Both   red tband     Modalities   Modalities Electrical Stimulation;Moist Heat      Moist Heat Therapy   Number Minutes Moist Heat 5 Minutes    Moist Heat Location Shoulder;Hip   under right thigh     Electrical Stimulation   Electrical Stimulation Location bilat traps    Electrical Stimulation Action premod    Electrical Stimulation Parameters to pt tolerance    Electrical Stimulation Goals Pain                  PT  Education - 02/12/20 1801    Education Details Discussed proper posture and form. Discussed modifications to HEP.    Person(s) Educated Patient    Methods Explanation;Handout;Verbal cues;Tactile cues;Demonstration    Comprehension Verbalized understanding;Returned demonstration;Tactile cues required;Verbal cues required            PT Short Term Goals - 02/02/20 1212      PT SHORT TERM GOAL #1   Title Pt will report worst pain to be <8/10    Baseline 10/10 pain at it's worst    Time 3    Period Weeks    Status New    Target Date 02/23/20      PT SHORT TERM GOAL #2   Title Pt will have improved bilat hamstring extensibilty by 10 deg for improved function with daily tasks    Baseline Currently 160 deg on R, 120 deg on L in SLR    Time 3    Period Weeks    Status New    Target Date 02/23/20      PT SHORT TERM GOAL #3   Title Pt will have improved lumbar ROM by ~10% for improved function with daily tasks    Baseline Lumbar flexion limited ~90% due to pain, extension limited ~80% due to pain    Time 3    Period Weeks    Status New    Target Date 02/23/20      PT SHORT TERM GOAL #4   Title Pt will be independent with her stretching exercises in her HEP    Baseline Limited understanding with new stretches    Time 3    Period Weeks    Status New    Target Date 02/23/20      PT SHORT TERM GOAL #5   Title Pt will improve bilat hip abduction and extension to at least 4+/5    Baseline R hip abduction 3+/5, bilat hip extension 4-/5    Time 3    Period Weeks    Status New    Target Date 02/23/20             PT Long Term Goals - 02/02/20 1213      PT LONG TERM GOAL #1   Title Pt will report worst pain to be <6/10 and </= 1/10 pain at rest    Time 8  Period Weeks    Status New    Target Date 03/29/20      PT LONG TERM GOAL #2   Title Pt will be independent with HEP    Time 8    Period Weeks    Status New    Target Date 03/29/20      PT LONG TERM GOAL #3    Title Pt will improve lumbar ROM to at least <50% limitation in all directions    Baseline Flexion limited ~90%, extension limited ~80%    Time 8    Period Weeks    Status New    Target Date 03/29/20      PT LONG TERM GOAL #4   Title Pt will improve bilat hip strength to grossly 5/5 throughout    Baseline R hip abduction 3+/5, bilat extension 4/5    Time 8    Period Weeks    Status New    Target Date 03/29/20      PT LONG TERM GOAL #5   Title Pt will improve FOTO score to </=57% limitation    Baseline FOTO score: 62% limitation    Time 8    Period Weeks    Status New    Target Date 03/29/20                 Plan - 02/12/20 1745    Clinical Impression Statement Pt with less lumbar pain this session -- mostly complains of pain in the back of Rt knee. Pt found to be overextending with hamstring stretch and educated to decrease her stretch. Treatment focused on increasing bilat hip strength, postural education, and reducing pain. Pt provided new HEP.    Personal Factors and Comorbidities Age;Fitness;Comorbidity 1;Comorbidity 2;Time since onset of injury/illness/exacerbation    Comorbidities OA, obesity, chronic bilat LE edema    Examination-Activity Limitations Bend;Stairs    Examination-Participation Restrictions Community Activity;Laundry;Cleaning    Rehab Potential Good    PT Frequency 1x / week    PT Duration 6 weeks    PT Treatment/Interventions ADLs/Self Care Home Management;Electrical Stimulation;Cryotherapy;Iontophoresis 4mg /ml Dexamethasone;Moist Heat;Ultrasound;Traction;Stair training;Functional mobility training;Therapeutic activities;Therapeutic exercise;Balance training;Neuromuscular re-education;Patient/family education;Manual techniques;Passive range of motion;Taping    PT Next Visit Plan Assess response to HEP, modify as needed. Continue stretching and strengthening core/hip and posture. Consider manual therapy, e-stim and taping.    PT Home Exercise Plan  [id number]    Consulted and Agree with Plan of Care Patient           Patient will benefit from skilled therapeutic intervention in order to improve the following deficits and impairments:  Decreased range of motion, Increased fascial restricitons, Impaired tone, Obesity, Decreased activity tolerance, Decreased endurance, Pain, Hypomobility, Impaired flexibility, Improper body mechanics, Decreased mobility, Decreased strength, Increased edema, Postural dysfunction  Visit Diagnosis: Chronic bilateral low back pain without sciatica  Muscle weakness (generalized)  Pain in left hip  Pain in right hip  Abnormal posture     Problem List Patient Active Problem List   Diagnosis Date Noted  . Post-operative state 06/16/2014  . Obstructive apnea 03/20/2013  . 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 [address] [place] PT, DPT 02/12/2020, 6:04 PM  [facility] [address] [place], [place], [address] Phone: [phone]   Fax:  [phone]  Name: Claire Green MRN: [id number] Date of Birth: Feb 04, 1966

## 2020-02-19 ENCOUNTER — Ambulatory Visit: Payer: Medicaid Other | Admitting: Physical Therapy

## 2020-02-23 ENCOUNTER — Other Ambulatory Visit: Payer: Self-pay

## 2020-02-23 ENCOUNTER — Ambulatory Visit: Payer: Medicaid Other | Admitting: Physical Therapy

## 2020-02-23 DIAGNOSIS — M6281 Muscle weakness (generalized): Secondary | ICD-10-CM

## 2020-02-23 DIAGNOSIS — M25552 Pain in left hip: Secondary | ICD-10-CM

## 2020-02-23 DIAGNOSIS — G8929 Other chronic pain: Secondary | ICD-10-CM

## 2020-02-23 DIAGNOSIS — M545 Low back pain, unspecified: Secondary | ICD-10-CM

## 2020-02-23 DIAGNOSIS — R293 Abnormal posture: Secondary | ICD-10-CM

## 2020-02-23 DIAGNOSIS — M25551 Pain in right hip: Secondary | ICD-10-CM

## 2020-02-23 NOTE — Therapy (Signed)
[place], [place], [address] Phone: [phone]   Fax:  [phone]  Physical Therapy Treatment  Patient Details  Name: Claire Green MRN: [id number] Date of Birth: 1966-03-16 Referring Provider (PT): Dorene Ar, MD   Encounter Date: 02/23/2020   PT End of Session - 02/23/20 1448    Visit Number 3    Number of Visits 4    Authorization Type MCD -- pending auth    Authorization - Visit Number 3    Authorization - Number of Visits 4    PT Start Time 8101    PT Stop Time 1630    PT Time Calculation (min) 41 min    Activity Tolerance Patient limited by pain   Increased pain in hip and knee after session   Behavior During Therapy [facility] for tasks assessed/performed           Past Medical History:  Diagnosis Date  . Anemia   . Anxiety   . Apnea, sleep    Does not use CPAP every night but some nights  . Arthritis   . Arthritis, low back   . Bronchitis   . Depression   . GERD (gastroesophageal reflux disease)   . Hyperlipidemia   . Hypertension   . Morbid obesity ([place])   . Sleep apnea    On CPAP  . Strep throat     Past Surgical History:  Procedure Laterality Date  . ABDOMINAL HYSTERECTOMY Bilateral 06/16/2014   Procedure: SUPERCERVICAL HYSTERECTOMY ABDOMINAL, LEFT SALPINGO-OPPHERECTOMY, AND RIGHT SALPINGECTOMY;  Surgeon: Emily Filbert, MD;  Location: [facility];  Service: Gynecology;  Laterality: Bilateral;  With bilateral salpingectomy  . BREAST EXCISIONAL BIOPSY Bilateral 1997  . BREAST SURGERY  1997   cyst removed bilat breast  . c section  1982, 1988, 1989   x [place]  . Upper teeth removed    . WISDOM TOOTH EXTRACTION      There were no vitals filed for this visit.   Subjective Assessment - 02/23/20 1533    Subjective Pt reports she has found she has thyroid issues. Pt reports her back has felt about the same but feels more soreness than pain. Pt states she's been  doing the exercises. She does report some soreness after exercises. Pt reports 9/10 pain at its worst this week (yesterday and the day before) in feet and right knee    Limitations House hold activities;Other (comment)    Diagnostic tests MRI 11/12/19: 1. 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 There is an L5-S1 disc bulge with small central protrusion. 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    Patient Stated Goals Reduce pain                             [facility] Adult PT Treatment/Exercise - 02/23/20 0001      Lumbar Exercises: Stretches   Piriformis Stretch Right;Left;30 seconds      Lumbar Exercises: Standing   Wall Slides 10 reps    Row Strengthening;10 reps      Lumbar Exercises: Supine   Dead Bug 20 reps    Bridge 10 reps    Bridge with March 5 reps      Lumbar Exercises: Sidelying   Other Sidelying Lumbar Exercises side plank 20 sec x 2 sets on L, side crunch 3 sets x 10 reps  Lumbar Exercises: Prone   Straight Leg Raise 10 reps    Opposite Arm/Leg Raise Right arm/Left leg;Left arm/Right leg;10 reps    Other Prone Lumbar Exercises plank on knees 30 sec, 20 sec      Knee/Hip Exercises: Standing   Hip Abduction Stengthening;10 reps;Both   green tband   Hip Extension Stengthening;Both;10 reps   green tband                   PT Short Term Goals - 02/02/20 1212      PT SHORT TERM GOAL #1   Title Pt will report worst pain to be <8/10    Baseline 10/10 pain at it's worst    Time 3    Period Weeks    Status New    Target Date 02/23/20      PT SHORT TERM GOAL #2   Title Pt will have improved bilat hamstring extensibilty by 10 deg for improved function with daily tasks    Baseline Currently 160 deg on R, 120 deg on L in SLR    Time 3    Period Weeks    Status New    Target Date 02/23/20      PT SHORT TERM GOAL #3   Title Pt will have improved lumbar ROM by ~10% for improved function with daily  tasks    Baseline Lumbar flexion limited ~90% due to pain, extension limited ~80% due to pain    Time 3    Period Weeks    Status New    Target Date 02/23/20      PT SHORT TERM GOAL #4   Title Pt will be independent with her stretching exercises in her HEP    Baseline Limited understanding with new stretches    Time 3    Period Weeks    Status New    Target Date 02/23/20      PT SHORT TERM GOAL #5   Title Pt will improve bilat hip abduction and extension to at least 4+/5    Baseline R hip abduction 3+/5, bilat hip extension 4-/5    Time 3    Period Weeks    Status New    Target Date 02/23/20             PT Long Term Goals - 02/02/20 1213      PT LONG TERM GOAL #1   Title Pt will report worst pain to be <6/10 and </= 1/10 pain at rest    Time 8    Period Weeks    Status New    Target Date 03/29/20      PT LONG TERM GOAL #2   Title Pt will be independent with HEP    Time 8    Period Weeks    Status New    Target Date 03/29/20      PT LONG TERM GOAL #3   Title Pt will improve lumbar ROM to at least <50% limitation in all directions    Baseline Flexion limited ~90%, extension limited ~80%    Time 8    Period Weeks    Status New    Target Date 03/29/20      PT LONG TERM GOAL #4   Title Pt will improve bilat hip strength to grossly 5/5 throughout    Baseline R hip abduction 3+/5, bilat extension 4/5    Time 8    Period Weeks    Status New    Target  Date 03/29/20      PT LONG TERM GOAL #5   Title Pt will improve FOTO score to </=57% limitation    Baseline FOTO score: 62% limitation    Time 8    Period Weeks    Status New    Target Date 03/29/20                 Plan - 02/23/20 1449    Clinical Impression Statement Pt reports more back soreness than pain. Pt reports continued R knee pain affecting her standing and weight bearing. Treatment focused on progressing hip, core, and posture strengthening. Pt is demonstrating improved bilat hip ext  strength.    Personal Factors and Comorbidities Age;Fitness;Comorbidity 1;Comorbidity 2;Time since onset of injury/illness/exacerbation    Comorbidities OA, obesity, chronic bilat LE edema    Examination-Activity Limitations Bend;Stairs    Examination-Participation Restrictions Community Activity;Laundry;Cleaning    Stability/Clinical Decision Making Evolving/Moderate complexity    Clinical Decision Making Moderate    Rehab Potential Good    PT Frequency 1x / week    PT Duration 6 weeks    PT Treatment/Interventions ADLs/Self Care Home Management;Electrical Stimulation;Cryotherapy;Iontophoresis 4mg /ml Dexamethasone;Moist Heat;Ultrasound;Traction;Stair training;Functional mobility training;Therapeutic activities;Therapeutic exercise;Balance training;Neuromuscular re-education;Patient/family education;Manual techniques;Passive range of motion;Taping    PT Next Visit Plan Assess response to HEP, modify as needed. Continue stretching and strengthening core/hip and posture. Continue/progress planking. Consider manual therapy, e-stim and taping. Assess R knee as indicated    PT Home Exercise Plan [id number]    Consulted and Agree with Plan of Care Patient           Patient will benefit from skilled therapeutic intervention in order to improve the following deficits and impairments:  Decreased range of motion, Increased fascial restricitons, Impaired tone, Obesity, Decreased activity tolerance, Decreased endurance, Pain, Hypomobility, Impaired flexibility, Improper body mechanics, Decreased mobility, Decreased strength, Increased edema, Postural dysfunction  Visit Diagnosis: Chronic bilateral low back pain without sciatica  Muscle weakness (generalized)  Pain in left hip  Pain in right hip  Abnormal posture     Problem List Patient Active Problem List   Diagnosis Date Noted  . Post-operative state 06/16/2014  . Obstructive apnea 03/20/2013  . 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 Episodic  paroxysmal anxiety disorder 10/16/2012    [facility] April Gordy Levan PT, DPT 02/23/2020, 3:39 PM  [facility] [address] [place], [place], [address] Phone: [phone]   Fax:  [phone]  Name: Claire Green MRN: [id number] Date of Birth: June 21, 1966

## 2020-02-27 ENCOUNTER — Ambulatory Visit: Payer: Medicaid Other | Attending: Anesthesiology | Admitting: Physical Therapy

## 2020-02-27 ENCOUNTER — Other Ambulatory Visit: Payer: Self-pay

## 2020-02-27 DIAGNOSIS — M25552 Pain in left hip: Secondary | ICD-10-CM

## 2020-02-27 DIAGNOSIS — M545 Low back pain, unspecified: Secondary | ICD-10-CM

## 2020-02-27 DIAGNOSIS — M25551 Pain in right hip: Secondary | ICD-10-CM | POA: Diagnosis present

## 2020-02-27 DIAGNOSIS — R293 Abnormal posture: Secondary | ICD-10-CM

## 2020-02-27 DIAGNOSIS — G8929 Other chronic pain: Secondary | ICD-10-CM | POA: Diagnosis present

## 2020-02-27 DIAGNOSIS — M6281 Muscle weakness (generalized): Secondary | ICD-10-CM | POA: Diagnosis present

## 2020-02-27 NOTE — Therapy (Signed)
[place], [place], [address] Phone: [phone]   Fax:  [phone]  Physical Therapy Treatment  Patient Details  Name: Claire Green MRN: [id number] Date of Birth: 20-Mar-1966 Referring Provider (PT): Dorene Ar, MD   Encounter Date: 02/27/2020   PT End of Session - 02/27/20 7408    Visit Number 4    Number of Visits 16    Authorization Type MCD -- re-auth requested    Authorization - Visit Number 4    Authorization - Number of Visits 4    PT Start Time 0840    PT Stop Time 0930    PT Time Calculation (min) 50 min    Activity Tolerance Patient limited by pain   Increased pain in hip and knee after session   Behavior During Therapy [facility] for tasks assessed/performed           Past Medical History:  Diagnosis Date   Anemia    Anxiety    Apnea, sleep    Does not use CPAP every night but some nights   Arthritis    Arthritis, low back    Bronchitis    Depression    GERD (gastroesophageal reflux disease)    Hyperlipidemia    Hypertension    Morbid obesity ([place])    Sleep apnea    On CPAP   Strep throat     Past Surgical History:  Procedure Laterality Date   ABDOMINAL HYSTERECTOMY Bilateral 06/16/2014   Procedure: SUPERCERVICAL HYSTERECTOMY ABDOMINAL, LEFT SALPINGO-OPPHERECTOMY, AND RIGHT SALPINGECTOMY;  Surgeon: Emily Filbert, MD;  Location: [facility];  Service: Gynecology;  Laterality: Bilateral;  With bilateral salpingectomy   BREAST EXCISIONAL BIOPSY Bilateral [place]   cyst removed bilat breast   c section  1982, 1988, 1989   x [place]   Upper teeth removed     WISDOM TOOTH EXTRACTION      There were no vitals filed for this visit.   Subjective Assessment - 02/27/20 0841    Subjective Pt states it's been going fine at home. Reports she's just been sore from doing the exercises. No back pain. Pt reports primarily R posterior  knee pain.    Limitations House hold activities;Other (comment)    Diagnostic tests MRI 11/12/19: 1. 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 There is an L5-S1 disc bulge with small central protrusion. 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 No/denies              [facility] PT Assessment - 02/27/20 0001      AROM   Lumbar Flexion ~60% limited    Lumbar Extension ~60% limited      Strength   Right Hip Flexion 4/5    Right Hip Extension 4/5    Right Hip ABduction 4/5    Left Hip Extension 4/5      Flexibility   Hamstrings 108 deg SLR on L, 115 deg on R    Quadriceps 105 deg on L, 95 deg on R                         [facility] Adult PT Treatment/Exercise - 02/27/20 0001      Lumbar Exercises: Stretches   Sports administrator 30 seconds;Right;Left;2 reps  Lumbar Exercises: Aerobic   Elliptical L1 x 5 min      Knee/Hip Exercises: Stretches   Soleus Stretch 30 seconds;Both      Knee/Hip Exercises: Standing   Heel Raises Both;10 reps    Other Standing Knee Exercises Mini squat x 10 reps      Knee/Hip Exercises: Seated   Hamstring Curl Strengthening;Both;10 reps   green tband                 PT Education - 02/27/20 0935    Education Details Discussed knee hyper extension during standing and walking likely contributing to pt's knee pain along with decreased quad control and hamstring strength.    Person(s) Educated Patient    Methods Explanation;Demonstration;Tactile cues;Verbal cues;Handout    Comprehension Verbalized understanding;Returned demonstration;Verbal cues required;Tactile cues required            PT Short Term Goals - 02/27/20 0845      PT SHORT TERM GOAL #1   Title Pt will report worst pain to be <8/10    Baseline 8 or 9 knee pain; back is just sorenes    Time 3    Period Weeks    Status Partially Met    Target Date 02/23/20      PT  SHORT TERM GOAL #2   Title Pt will have improved bilat hamstring extensibilty by 10 deg for improved function with daily tasks    Baseline Currently 160 deg on R, 120 deg on L in SLR    Time 3    Period Weeks    Status Achieved    Target Date 02/23/20      PT SHORT TERM GOAL #3   Title Pt will have improved lumbar ROM by ~10% for improved function with daily tasks    Baseline Lumbar flexion limited ~90% due to pain, extension limited ~80% due to pain    Time 3    Period Weeks    Status Achieved    Target Date 02/23/20      PT SHORT TERM GOAL #4   Title Pt will be independent with her stretching exercises in her HEP    Baseline Limited understanding with new stretches    Time 3    Period Weeks    Status Achieved    Target Date 02/23/20      PT SHORT TERM GOAL #5   Title Pt will improve bilat hip abduction and extension to at least 4+/5    Baseline R hip abduction 3+/5, bilat hip extension 4-/5    Time 3    Period Weeks    Status On-going    Target Date 02/23/20             PT Long Term Goals - 02/27/20 0946      PT LONG TERM GOAL #1   Title Pt will report worst pain to be <6/10 and </= 1/10 pain at rest    Baseline 0/10 back pain; 8 to 9/10 knee pain    Time 6    Period Weeks    Status Revised    Target Date 04/09/20      PT LONG TERM GOAL #2   Title Pt will be independent with HEP    Time 6    Period Weeks    Status Revised    Target Date 04/09/20      PT LONG TERM GOAL #3   Title Pt will improve lumbar ROM to at least <50% limitation in  all directions    Baseline Flexion limited ~50 to 60%, extension limited ~50%    Time 6    Period Weeks    Status Revised    Target Date 04/09/20      PT LONG TERM GOAL #4   Title Pt will improve bilat hip strength to grossly 5/5 throughout    Baseline R hip abduction 4/5, bilat extension 4/5    Time 6    Period Weeks    Status Revised    Target Date 04/09/20      PT LONG TERM GOAL #5   Title Pt will improve FOTO  score to </=57% limitation    Baseline FOTO score: 62% limitation    Time 6    Period Weeks    Status On-going    Target Date 04/09/20                 Plan - 02/27/20 0937    Clinical Impression Statement Pt demonstrates increasing strength and lumbar motion with reduction of back pain. Pt is meeting short term goals. Pt's R knee pain > back pain at this time. Treatment focused on controlling knee hyperextension with quad control/neuromuscular education, quad stretching, hamstring strengthening and manual therapy. Pt will continue to benefit from PT to fully manage back pain and knee pain to return her to her optimal level of function    Personal Factors and Comorbidities Age;Fitness;Comorbidity 1;Comorbidity 2;Time since onset of injury/illness/exacerbation    Comorbidities OA, obesity, chronic bilat LE edema    Examination-Activity Limitations Bend;Stairs    Examination-Participation Restrictions Community Activity;Laundry;Cleaning    Stability/Clinical Decision Making Evolving/Moderate complexity    Rehab Potential Good    PT Frequency 1x / week    PT Duration 6 weeks    PT Treatment/Interventions ADLs/Self Care Home Management;Electrical Stimulation;Cryotherapy;Iontophoresis 69m/ml Dexamethasone;Moist Heat;Ultrasound;Traction;Stair training;Functional mobility training;Therapeutic activities;Therapeutic exercise;Balance training;Neuromuscular re-education;Patient/family education;Manual techniques;Passive range of motion;Taping    PT Next Visit Plan Assess response to HEP, modify as needed. Continue stretching and strengthening quad/hamstring control, core/hip and posture. Continue/progress planking. Consider manual therapy, e-stim and taping.    PT Home Exercise Plan [id number]    Consulted and Agree with Plan of Care Patient           Patient will benefit from skilled therapeutic intervention in order to improve the following deficits and impairments:  Decreased range of  motion, Increased fascial restricitons, Impaired tone, Obesity, Decreased activity tolerance, Decreased endurance, Pain, Hypomobility, Impaired flexibility, Improper body mechanics, Decreased mobility, Decreased strength, Increased edema, Postural dysfunction  Visit Diagnosis: Chronic bilateral low back pain without sciatica - Plan: PT plan of care cert/re-cert  Muscle weakness (generalized) - Plan: PT plan of care cert/re-cert  Pain in left hip - Plan: PT plan of care cert/re-cert  Pain in right hip - Plan: PT plan of care cert/re-cert  Abnormal posture - Plan: PT plan of care cert/re-cert     Problem List Patient Active Problem List   Diagnosis Date Noted   Post-operative state 06/16/2014   Obstructive apnea 03/20/2013   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

## 2020-03-12 ENCOUNTER — Ambulatory Visit: Payer: Medicaid Other | Admitting: Physical Therapy

## 2020-03-26 ENCOUNTER — Ambulatory Visit: Payer: Medicaid Other | Admitting: Physical Therapy

## 2020-03-26 ENCOUNTER — Other Ambulatory Visit: Payer: Self-pay

## 2020-03-26 DIAGNOSIS — M25551 Pain in right hip: Secondary | ICD-10-CM

## 2020-03-26 DIAGNOSIS — M6281 Muscle weakness (generalized): Secondary | ICD-10-CM

## 2020-03-26 DIAGNOSIS — R293 Abnormal posture: Secondary | ICD-10-CM

## 2020-03-26 DIAGNOSIS — M25552 Pain in left hip: Secondary | ICD-10-CM

## 2020-03-26 DIAGNOSIS — G8929 Other chronic pain: Secondary | ICD-10-CM

## 2020-03-26 DIAGNOSIS — M545 Low back pain: Secondary | ICD-10-CM | POA: Diagnosis not present

## 2020-03-26 NOTE — Therapy (Signed)
[place], [place], [address] Phone: [phone]   Fax:  [phone]  Physical Therapy Treatment and Recertification  Patient Details  Name: Claire Green MRN: [id number] Date of Birth: 08-05-1966 Referring Provider (PT): Dorene Ar, MD   Encounter Date: 03/26/2020   PT End of Session - 03/26/20 1153    Visit Number 5    Number of Visits 16    Date for PT Re-Evaluation 05/07/20    Authorization Type MCD -- Jackquline [place] no auth required until 9/28. Approved for 27 visits    Authorization - Visit Number 5    PT Start Time 0915    PT Stop Time 1000    PT Time Calculation (min) 45 min    Activity Tolerance Patient limited by pain   Increased pain in hip and knee after session   Behavior During Therapy [facility] for tasks assessed/performed           Past Medical History:  Diagnosis Date  . Anemia   . Anxiety   . Apnea, sleep    Does not use CPAP every night but some nights  . Arthritis   . Arthritis, low back   . Bronchitis   . Depression   . GERD (gastroesophageal reflux disease)   . Hyperlipidemia   . Hypertension   . Morbid obesity ([place])   . Sleep apnea    On CPAP  . Strep throat     Past Surgical History:  Procedure Laterality Date  . ABDOMINAL HYSTERECTOMY Bilateral 06/16/2014   Procedure: SUPERCERVICAL HYSTERECTOMY ABDOMINAL, LEFT SALPINGO-OPPHERECTOMY, AND RIGHT SALPINGECTOMY;  Surgeon: Emily Filbert, MD;  Location: [facility];  Service: Gynecology;  Laterality: Bilateral;  With bilateral salpingectomy  . BREAST EXCISIONAL BIOPSY Bilateral 1997  . BREAST SURGERY  1997   cyst removed bilat breast  . c section  1982, 1988, 1989   x [place]  . Upper teeth removed    . WISDOM TOOTH EXTRACTION      There were no vitals filed for this visit.   Subjective Assessment - 03/26/20 0925    Subjective Pt states she had an incidence of increased knee swelling from arthritis that led  to a cancellation. Pt states she's been walking with her son. Pt reports she stops when it starts to feel her back getting irritated. Pt states her hips and back still get really sore but are no longer painful unless she has her arthritis flares. Pt reports she walks ~1 block before she can start to feel her back. She notes that her biggest limitation is more in her knees.    Limitations House hold activities;Other (comment)    How long can you walk comfortably? ~1 block    Diagnostic tests MRI 11/12/19: 1. 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 There is an L5-S1 disc bulge with small central protrusion. 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 No/denies              [facility] PT Assessment - 03/26/20 0001      Strength   Right Hip Flexion 5/5    Right Hip Extension 4+/5    Right Hip ABduction 5/5    Left Hip Flexion 5/5    Left Hip Extension 4+/5    Left Hip ABduction 5/5  [facility] Adult PT Treatment/Exercise - 03/26/20 0001      Lumbar Exercises: Aerobic   Recumbent Bike L3 x 5 min      Knee/Hip Exercises: Stretches   Sports administrator Both;30 seconds;2 reps      Manual Therapy   Manual Therapy Soft tissue mobilization    Soft tissue mobilization foam roll R hamstring                  PT Education - 03/26/20 1200    Education Details Educated pt on neutral spine and strengthening her trunk and core stability in neutral spine.    Person(s) Educated Patient    Methods Explanation;Demonstration;Tactile cues;Verbal cues;Handout    Comprehension Verbalized understanding;Returned demonstration;Verbal cues required;Tactile cues required            PT Short Term Goals - 03/26/20 1201      PT SHORT TERM GOAL #1   Title Pt will report worst pain to be <8/10    Baseline 8 or 9 knee pain; back is just sorenes    Time 6    Period Weeks     Status Revised    Target Date 05/07/20      PT SHORT TERM GOAL #2   Title Pt will have improved bilat hamstring extensibilty by 10 deg for improved function with daily tasks    Baseline Currently 160 deg on R, 120 deg on L in SLR    Time 3    Period Weeks    Status Achieved    Target Date 02/23/20      PT SHORT TERM GOAL #3   Title Pt will have improved lumbar ROM by ~10% for improved function with daily tasks    Baseline Lumbar flexion limited ~90% due to pain, extension limited ~80% due to pain    Time 3    Period Weeks    Status Achieved    Target Date 02/23/20      PT SHORT TERM GOAL #4   Title Pt will be independent with her stretching exercises in her HEP    Baseline Limited understanding with new stretches    Time 3    Period Weeks    Status Achieved    Target Date 02/23/20      PT SHORT TERM GOAL #5   Title Pt will improve bilat hip abduction and extension to at least 4+/5    Baseline R hip abduction 3+/5, bilat hip extension 4-/5    Time 3    Period Weeks    Status Achieved    Target Date 02/23/20             PT Long Term Goals - 03/26/20 1202      PT LONG TERM GOAL #1   Title Pt will report worst pain to be <6/10 and </= 1/10 pain at rest    Baseline 0/10 back pain; 8 to 9/10 knee pain    Time 6    Period Weeks    Status On-going      PT LONG TERM GOAL #2   Title Pt will be independent with HEP    Time 6    Period Weeks    Status On-going      PT LONG TERM GOAL #3   Title Pt will improve lumbar ROM to at least <50% limitation in all directions    Baseline Flexion limited ~50 to 60%, extension limited ~50%    Time 6    Period Weeks  Status Achieved      PT LONG TERM GOAL #4   Title Pt will improve bilat hip strength to grossly 5/5 throughout    Baseline R hip abduction 4/5, bilat extension 4/5    Time 6    Period Weeks    Status On-going      PT LONG TERM GOAL #5   Title Pt will improve FOTO score to </=57% limitation    Baseline FOTO  score: 62% limitation    Time 6    Period Weeks    Status On-going      Additional Long Term Goals   Additional Long Term Goals Yes      PT LONG TERM GOAL #6   Title Pt will be able to tolerate walking 2 blocks without back pain and knee pain </= 4/10    Baseline Discomfort after 1 block    Time 6    Period Weeks    Status New    Target Date 05/07/20      PT LONG TERM GOAL #7   Title Pt will be able to demonstrate functional squat with 25# to demonstrate improved knee and back strength without pain    Time 6    Period Weeks    Status New    Target Date 05/07/20                 Plan - 03/26/20 1157    Clinical Impression Statement Pt with some continued soreness in low back with increased activity (i.e. walking). Pt found to have much improved hip strength. Core remains weak -- Treatment session focused on improving core stability and strength. Knee pain remains an issue to pt's standing endurance. Pt's goals updated accordingly. Pt will continue to benefit from PT to optimize her level of function.    Personal Factors and Comorbidities Age;Fitness;Comorbidity 1;Comorbidity 2;Time since onset of injury/illness/exacerbation    Comorbidities OA, obesity, chronic bilat LE edema    Examination-Activity Limitations Bend;Stairs    Examination-Participation Restrictions Community Activity;Laundry;Cleaning    Stability/Clinical Decision Making Evolving/Moderate complexity    Rehab Potential Good    PT Frequency 1x / week    PT Duration 6 weeks    PT Treatment/Interventions ADLs/Self Care Home Management;Electrical Stimulation;Cryotherapy;Iontophoresis 4mg /ml Dexamethasone;Moist Heat;Ultrasound;Traction;Stair training;Functional mobility training;Therapeutic activities;Therapeutic exercise;Balance training;Neuromuscular re-education;Patient/family education;Manual techniques;Passive range of motion;Taping    PT Next Visit Plan Assess response to HEP, modify as needed. Continue  stretching and strengthening quad/hamstring control, core/hip and posture. Continue/progress planking. Consider manual therapy and taping. Assess knee pain    PT Home Exercise Plan [id number]: initiated palloff press, seated crunch, and quadruped position    Consulted and Agree with Plan of Care Patient           Patient will benefit from skilled therapeutic intervention in order to improve the following deficits and impairments:  Decreased range of motion, Increased fascial restricitons, Impaired tone, Obesity, Decreased activity tolerance, Decreased endurance, Pain, Hypomobility, Impaired flexibility, Improper body mechanics, Decreased mobility, Decreased strength, Increased edema, Postural dysfunction  Visit Diagnosis: Chronic bilateral low back pain without sciatica  Muscle weakness (generalized)  Pain in left hip  Pain in right hip  Abnormal posture     Problem List Patient Active Problem List   Diagnosis Date Noted  . Post-operative state 06/16/2014  . Obstructive apnea 03/20/2013  . 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 [place], [place], [address] Phone: [phone]   Fax:  [phone]  Name: Claire Green MRN: [id number] Date of Birth: June 04, 1966

## 2020-04-09 ENCOUNTER — Ambulatory Visit: Payer: Medicaid Other | Admitting: Physical Therapy

## 2020-04-14 ENCOUNTER — Ambulatory Visit: Payer: Medicaid Other | Attending: Anesthesiology | Admitting: Physical Therapy

## 2020-04-14 ENCOUNTER — Other Ambulatory Visit: Payer: Self-pay

## 2020-04-14 DIAGNOSIS — G8929 Other chronic pain: Secondary | ICD-10-CM

## 2020-04-14 DIAGNOSIS — M25551 Pain in right hip: Secondary | ICD-10-CM | POA: Diagnosis present

## 2020-04-14 DIAGNOSIS — R293 Abnormal posture: Secondary | ICD-10-CM | POA: Insufficient documentation

## 2020-04-14 DIAGNOSIS — M25552 Pain in left hip: Secondary | ICD-10-CM | POA: Diagnosis present

## 2020-04-14 DIAGNOSIS — M545 Low back pain: Secondary | ICD-10-CM | POA: Diagnosis not present

## 2020-04-14 DIAGNOSIS — M6281 Muscle weakness (generalized): Secondary | ICD-10-CM | POA: Diagnosis present

## 2020-04-14 NOTE — Therapy (Signed)
[place] [employer], [place], [address] Phone: [phone]   Fax:  [phone]  Physical Therapy Treatment  Patient Details  Name: Claire Green MRN: [id number] Date of Birth: 07/22/1966 Referring Provider (PT): Dorene Ar, MD   Encounter Date: 04/14/2020   PT End of Session - 04/14/20 1319    Visit Number 6    Number of Visits 16    Date for PT Re-Evaluation 05/07/20    Authorization Type MCD -- Jackquline [place] no auth required until 9/28. Approved for 27 visits    Authorization - Visit Number 6    PT Start Time 1320    PT Stop Time 1410    PT Time Calculation (min) 50 min    Activity Tolerance Patient limited by pain   Increased pain in hip and knee after session   Behavior During Therapy [facility] for tasks assessed/performed           Past Medical History:  Diagnosis Date  . Anemia   . Anxiety   . Apnea, sleep    Does not use CPAP every night but some nights  . Arthritis   . Arthritis, low back   . Bronchitis   . Depression   . GERD (gastroesophageal reflux disease)   . Hyperlipidemia   . Hypertension   . Morbid obesity ([place])   . Sleep apnea    On CPAP  . Strep throat     Past Surgical History:  Procedure Laterality Date  . ABDOMINAL HYSTERECTOMY Bilateral 06/16/2014   Procedure: SUPERCERVICAL HYSTERECTOMY ABDOMINAL, LEFT SALPINGO-OPPHERECTOMY, AND RIGHT SALPINGECTOMY;  Surgeon: Emily Filbert, MD;  Location: [facility];  Service: Gynecology;  Laterality: Bilateral;  With bilateral salpingectomy  . BREAST EXCISIONAL BIOPSY Bilateral 1997  . BREAST SURGERY  1997   cyst removed bilat breast  . c section  1982, 1988, 1989   x [place]  . Upper teeth removed    . WISDOM TOOTH EXTRACTION      There were no vitals filed for this visit.   Subjective Assessment - 04/14/20 1324    Subjective Pt reports she got a puppy for her depression; pt reports increased stress taking care of the new  puppy. Pt states she has not had much time to do any exercises. No back pain, just sore. Pt reports less knee pain and more foot pain.    Limitations House hold activities;Other (comment)    How long can you walk comfortably? ~1 block    Diagnostic tests MRI 11/12/19: 1. 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 There is an L5-S1 disc bulge with small central protrusion. 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 No/denies                             [facility] Adult PT Treatment/Exercise - 04/14/20 0001      Lumbar Exercises: Stretches   Other Lumbar Stretch Exercise Upper trap stretch x 30 sec    Other Lumbar Stretch Exercise Levator stretch x 30 sec      Lumbar Exercises: Aerobic   Recumbent Bike L5 x 5 min      Lumbar Exercises: Supine   Dead Bug 10 reps    Bridge 10 reps    Bridge with March 10 reps  Other Supine Lumbar Exercises double leg bend to chest, march down alternating x 5      Lumbar Exercises: Quadruped   Straight Leg Raise 10 reps    Opposite Arm/Leg Raise 10 reps;Right arm/Left leg;Left arm/Right leg      Manual Therapy   Manual Therapy Soft tissue mobilization;Joint mobilization    Manual therapy comments Tennis ball self massage/trigger point release    Joint Mobilization Thoracic AP mobs grade III    Soft tissue mobilization TPR Upper trap, rhomboids, thoracic paraspinals                  PT Education - 04/14/20 1422    Education Details Educated pt on using tennis ball for self massage/trigger point release.    Person(s) Educated Patient    Methods Explanation;Demonstration;Verbal cues    Comprehension Verbalized understanding;Returned demonstration            PT Short Term Goals - 03/26/20 1201      PT SHORT TERM GOAL #1   Title Pt will report worst pain to be <8/10    Baseline 8 or 9 knee pain; back is just sorenes    Time 6     Period Weeks    Status Revised    Target Date 05/07/20      PT SHORT TERM GOAL #2   Title Pt will have improved bilat hamstring extensibilty by 10 deg for improved function with daily tasks    Baseline Currently 160 deg on R, 120 deg on L in SLR    Time 3    Period Weeks    Status Achieved    Target Date 02/23/20      PT SHORT TERM GOAL #3   Title Pt will have improved lumbar ROM by ~10% for improved function with daily tasks    Baseline Lumbar flexion limited ~90% due to pain, extension limited ~80% due to pain    Time 3    Period Weeks    Status Achieved    Target Date 02/23/20      PT SHORT TERM GOAL #4   Title Pt will be independent with her stretching exercises in her HEP    Baseline Limited understanding with new stretches    Time 3    Period Weeks    Status Achieved    Target Date 02/23/20      PT SHORT TERM GOAL #5   Title Pt will improve bilat hip abduction and extension to at least 4+/5    Baseline R hip abduction 3+/5, bilat hip extension 4-/5    Time 3    Period Weeks    Status Achieved    Target Date 02/23/20             PT Long Term Goals - 04/14/20 1415      PT LONG TERM GOAL #1   Title Pt will report worst pain to be <6/10 and </= 1/10 pain at rest    Baseline 0/10 back pain; 8 to 9/10 knee pain    Time 6    Period Weeks    Status Achieved      PT LONG TERM GOAL #2   Title Pt will be independent with HEP    Time 6    Period Weeks    Status On-going      PT LONG TERM GOAL #3   Title Pt will improve lumbar ROM to at least <50% limitation in all directions    Baseline  Flexion limited ~50 to 60%, extension limited ~50%    Time 6    Period Weeks    Status Achieved      PT LONG TERM GOAL #4   Title Pt will improve bilat hip strength to grossly 5/5 throughout    Baseline R hip abduction 4/5, bilat extension 4/5    Time 6    Period Weeks    Status On-going      PT LONG TERM GOAL #5   Title Pt will improve FOTO score to </=57%  limitation    Baseline FOTO score: 62% limitation    Time 6    Period Weeks    Status On-going      PT LONG TERM GOAL #6   Title Pt will be able to tolerate walking 2 blocks without back pain and knee pain </= 4/10    Baseline Pt states she is now able to amb more than a block without pain    Time 6    Period Weeks    Status Achieved      PT LONG TERM GOAL #7   Title Pt will be able to demonstrate functional squat with 25# to demonstrate improved knee and back strength without pain    Time 6    Period Weeks    Status On-going                 Plan - 04/14/20 1416    Clinical Impression Statement Pt states her back pain has improved overall -- just a mild soreness. Pt has been able to increase her walking. Treatment continues to focus on addressing core weakness and LE strengthening. Pt with much improved knee pain. Manual therapy provided due to increased upper trap tightness. She reports just some mild pain on initial standing after prolonged sitting. Pt is progressing very well. May consider d/c on next session.    Personal Factors and Comorbidities Age;Fitness;Comorbidity 1;Comorbidity 2;Time since onset of injury/illness/exacerbation    Comorbidities OA, obesity, chronic bilat LE edema    Examination-Activity Limitations Bend;Stairs    Examination-Participation Restrictions Community Activity;Laundry;Cleaning    Stability/Clinical Decision Making Evolving/Moderate complexity    Rehab Potential Good    PT Frequency 1x / week    PT Duration 6 weeks    PT Treatment/Interventions ADLs/Self Care Home Management;Electrical Stimulation;Cryotherapy;Iontophoresis 4mg /ml Dexamethasone;Moist Heat;Ultrasound;Traction;Stair training;Functional mobility training;Therapeutic activities;Therapeutic exercise;Balance training;Neuromuscular re-education;Patient/family education;Manual techniques;Passive range of motion;Taping    PT Next Visit Plan Assess response to HEP, modify as needed.  Continue stretching and strengthening quad/hamstring control, core/hip and posture. Perform squats. Introduce her to gym equipment to transition her to community program.    PT Home Exercise Plan [id number]: initiated palloff press, seated crunch, and quadruped position    Consulted and Agree with Plan of Care Patient           Patient will benefit from skilled therapeutic intervention in order to improve the following deficits and impairments:  Decreased range of motion, Increased fascial restricitons, Impaired tone, Obesity, Decreased activity tolerance, Decreased endurance, Pain, Hypomobility, Impaired flexibility, Improper body mechanics, Decreased mobility, Decreased strength, Increased edema, Postural dysfunction  Visit Diagnosis: Chronic bilateral low back pain without sciatica  Muscle weakness (generalized)  Pain in left hip  Pain in right hip  Abnormal posture     Problem List Patient Active Problem List   Diagnosis Date Noted  . Post-operative state 06/16/2014  . Obstructive apnea 03/20/2013  . 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 Episodic paroxysmal anxiety disorder 10/16/2012  [facility] [address] PT, DPT 04/14/2020, 2:23 PM  [facility] [address] [place], [place], [address] Phone: [phone]   Fax:  [phone]  Name: Claire Green MRN: [id number] Date of Birth: Nov 01, 1965

## 2020-04-23 ENCOUNTER — Other Ambulatory Visit: Payer: Self-pay

## 2020-04-23 ENCOUNTER — Ambulatory Visit: Payer: Medicaid Other | Admitting: Physical Therapy

## 2020-04-23 DIAGNOSIS — R293 Abnormal posture: Secondary | ICD-10-CM

## 2020-04-23 DIAGNOSIS — M25552 Pain in left hip: Secondary | ICD-10-CM

## 2020-04-23 DIAGNOSIS — M6281 Muscle weakness (generalized): Secondary | ICD-10-CM

## 2020-04-23 DIAGNOSIS — G8929 Other chronic pain: Secondary | ICD-10-CM

## 2020-04-23 DIAGNOSIS — M25551 Pain in right hip: Secondary | ICD-10-CM

## 2020-04-23 DIAGNOSIS — M545 Low back pain: Secondary | ICD-10-CM | POA: Diagnosis not present

## 2020-04-23 NOTE — Therapy (Signed)
[place], [place], [address] Phone: [phone]   Fax:  [phone]  Physical Therapy Treatment and Discharge  Patient Details  Name: Claire Green MRN: [id number] Date of Birth: 1966/01/02 Referring Provider (PT): Dorene Ar, MD   PHYSICAL THERAPY DISCHARGE SUMMARY  Visits from Start of Care: 7  Current functional level related to goals / functional outcomes: See below   Remaining deficits: See below   Education / Equipment: Proper form for squats  Plan: Patient agrees to discharge.  Patient goals were met. Patient is being discharged due to meeting the stated rehab goals.  ?????        Encounter Date: 04/23/2020   PT End of Session - 04/23/20 0919    Visit Number 7    Number of Visits 16    Date for PT Re-Evaluation 05/07/20    Authorization Type MCD -- Jackquline [place] no auth required until 9/28. Approved for 27 visits    Authorization - Visit Number 7    PT Start Time 0920    Activity Tolerance Patient limited by pain   Increased pain in hip and knee after session   Behavior During Therapy [facility] for tasks assessed/performed           Past Medical History:  Diagnosis Date  . Anemia   . Anxiety   . Apnea, sleep    Does not use CPAP every night but some nights  . Arthritis   . Arthritis, low back   . Bronchitis   . Depression   . GERD (gastroesophageal reflux disease)   . Hyperlipidemia   . Hypertension   . Morbid obesity ([place])   . Sleep apnea    On CPAP  . Strep throat     Past Surgical History:  Procedure Laterality Date  . ABDOMINAL HYSTERECTOMY Bilateral 06/16/2014   Procedure: SUPERCERVICAL HYSTERECTOMY ABDOMINAL, LEFT SALPINGO-OPPHERECTOMY, AND RIGHT SALPINGECTOMY;  Surgeon: Emily Filbert, MD;  Location: [facility];  Service: Gynecology;  Laterality: Bilateral;  With bilateral salpingectomy  . BREAST EXCISIONAL BIOPSY Bilateral 1997  . BREAST SURGERY  1997   cyst removed  bilat breast  . c section  1982, 1988, 1989   x [place]  . Upper teeth removed    . WISDOM TOOTH EXTRACTION      There were no vitals filed for this visit.   Subjective Assessment - 04/23/20 0921    Subjective Pt states she's been on her feet a lot and this has caused increased ankle swelling. Otherwise she reports no back or knee pain.    Limitations House hold activities;Other (comment)    How long can you walk comfortably? ~1 block    Diagnostic tests MRI 11/12/19: 1. 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 There is an L5-S1 disc bulge with small central protrusion. 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 No/denies    Pain Score 0-No pain              [facility] PT Assessment - 04/23/20 0001      Strength   Right Hip Flexion 5/5    Right Hip Extension 5/5    Right Hip External Rotation  5/5    Right Hip Internal Rotation 5/5    Right Hip ABduction 5/5    Left Hip Flexion 5/5    Left  Hip Extension 5/5    Left Hip External Rotation 5/5    Left Hip Internal Rotation 5/5    Left Hip ABduction 5/5                         [facility] Adult PT Treatment/Exercise - 04/23/20 0001      Lumbar Exercises: Aerobic   Tread Mill 0.8 to 1 mph for .2 miles    Recumbent Bike L5 x 4 min      Lumbar Exercises: Standing   Lifting From 12";10 reps    Lifting Weights (lbs) --   25# x 5 reps, 10# x 10 reps     Lumbar Exercises: Seated   Sit to Stand 10 reps                    PT Short Term Goals - 04/23/20 0935      PT SHORT TERM GOAL #1   Title Pt will report worst pain to be <8/10    Baseline 8 or 9 knee pain; back is just sorenes    Time 6    Period Weeks    Status Achieved    Target Date 05/07/20      PT SHORT TERM GOAL #2   Title Pt will have improved bilat hamstring extensibilty by 10 deg for improved function with daily tasks     Baseline Currently 160 deg on R, 120 deg on L in SLR    Time 3    Period Weeks    Status Achieved    Target Date 02/23/20      PT SHORT TERM GOAL #3   Title Pt will have improved lumbar ROM by ~10% for improved function with daily tasks    Baseline Lumbar flexion limited ~90% due to pain, extension limited ~80% due to pain    Time 3    Period Weeks    Status Achieved    Target Date 02/23/20      PT SHORT TERM GOAL #4   Title Pt will be independent with her stretching exercises in her HEP    Baseline Limited understanding with new stretches    Time 3    Period Weeks    Status Achieved    Target Date 02/23/20      PT SHORT TERM GOAL #5   Title Pt will improve bilat hip abduction and extension to at least 4+/5    Baseline R hip abduction 3+/5, bilat hip extension 4-/5    Time 3    Period Weeks    Status Achieved    Target Date 02/23/20             PT Long Term Goals - 04/23/20 0935      PT LONG TERM GOAL #1   Title Pt will report worst pain to be <6/10 and </= 1/10 pain at rest    Baseline 0/10 back pain; 8 to 9/10 knee pain    Time 6    Period Weeks    Status Achieved      PT LONG TERM GOAL #2   Title Pt will be independent with HEP    Time 6    Period Weeks    Status Achieved      PT LONG TERM GOAL #3   Title Pt will improve lumbar ROM to at least <50% limitation in all directions    Baseline Flexion limited ~50 to 60%, extension limited ~50%  Time 6    Period Weeks    Status Achieved      PT LONG TERM GOAL #4   Title Pt will improve bilat hip strength to grossly 5/5 throughout    Baseline R hip abduction 4/5, bilat extension 4/5    Time 6    Period Weeks    Status On-going      PT LONG TERM GOAL #5   Title Pt will improve FOTO score to </=57% limitation    Baseline n/a, pt is MCD    Time 6    Period Weeks    Status On-going      PT LONG TERM GOAL #6   Title Pt will be able to tolerate walking 2 blocks without back pain and knee pain </= 4/10      Baseline Pt states she is now able to amb more than a block without pain    Time 6    Period Weeks    Status Achieved      PT LONG TERM GOAL #7   Title Pt will be able to demonstrate functional squat with 25# to demonstrate improved knee and back strength without pain    Time 6    Period Weeks    Status Achieved                 Plan - 04/23/20 5852    Clinical Impression Statement Pt has been faithful to her HEP. Pt reports no back or knee pain. Pt has been meeting her therapy goals and is ready for D/C. Pt provided advanced HEP for strengthening. Cues required for proper form with squats.    Personal Factors and Comorbidities Age;Fitness;Comorbidity 1;Comorbidity 2;Time since onset of injury/illness/exacerbation    Comorbidities OA, obesity, chronic bilat LE edema    Examination-Activity Limitations Bend;Stairs    Examination-Participation Restrictions Community Activity;Laundry;Cleaning    Stability/Clinical Decision Making Evolving/Moderate complexity    Rehab Potential Good    PT Frequency 1x / week    PT Duration 6 weeks    PT Treatment/Interventions ADLs/Self Care Home Management;Electrical Stimulation;Cryotherapy;Iontophoresis 91m/ml Dexamethasone;Moist Heat;Ultrasound;Traction;Stair training;Functional mobility training;Therapeutic activities;Therapeutic exercise;Balance training;Neuromuscular re-education;Patient/family education;Manual techniques;Passive range of motion;Taping    PT Next Visit Plan Assess response to HEP, modify as needed. Continue stretching and strengthening quad/hamstring control, core/hip and posture. Perform squats. Introduce her to gym equipment to transition her to community program.    PT Home Exercise Plan [id number]: initiated palloff press, seated crunch, and quadruped position    Consulted and Agree with Plan of Care Patient           Patient will benefit from skilled therapeutic intervention in order to improve the following deficits and  impairments:  Decreased range of motion, Increased fascial restricitons, Impaired tone, Obesity, Decreased activity tolerance, Decreased endurance, Pain, Hypomobility, Impaired flexibility, Improper body mechanics, Decreased mobility, Decreased strength, Increased edema, Postural dysfunction  Visit Diagnosis: Chronic bilateral low back pain without sciatica  Muscle weakness (generalized)  Pain in left hip  Pain in right hip  Abnormal posture     Problem List Patient Active Problem List   Diagnosis Date Noted  . Post-operative state 06/16/2014  . Obstructive apnea 03/20/2013  . 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 Episodic paroxysmal anxiety disorder 10/16/2012    GTimpanogos Regional HospitalApril MGordy LevanPT, DPT 04/23/2020, 10:00 AM  [facility]18[address]GLe[place] [address]Phone: [phone]  Fax:  [phone] Name: Claire WIRTHLINMRN: [id number]  of Birth: October 11, 1965

## 2020-04-30 ENCOUNTER — Ambulatory Visit: Payer: Medicaid Other | Admitting: Physical Therapy

## 2020-05-07 ENCOUNTER — Encounter: Payer: Medicaid Other | Admitting: Physical Therapy

## 2020-05-10 ENCOUNTER — Encounter: Payer: Self-pay | Admitting: Radiology

## 2020-08-04 ENCOUNTER — Other Ambulatory Visit: Payer: Self-pay | Admitting: Pediatrics

## 2020-08-04 ENCOUNTER — Other Ambulatory Visit: Payer: Self-pay | Admitting: Nurse Practitioner

## 2020-08-04 DIAGNOSIS — N644 Mastodynia: Secondary | ICD-10-CM

## 2020-09-08 ENCOUNTER — Ambulatory Visit: Payer: Medicaid Other

## 2020-09-08 ENCOUNTER — Ambulatory Visit
Admission: RE | Admit: 2020-09-08 | Discharge: 2020-09-08 | Disposition: A | Discharge status: Home / Self Care | Payer: Medicaid Other | Source: Ambulatory Visit | Attending: Nurse Practitioner | Admitting: Nurse Practitioner

## 2020-09-08 ENCOUNTER — Other Ambulatory Visit: Payer: Self-pay

## 2020-09-08 DIAGNOSIS — N644 Mastodynia: Secondary | ICD-10-CM

## 2020-09-17 ENCOUNTER — Ambulatory Visit (INDEPENDENT_AMBULATORY_CARE_PROVIDER_SITE_OTHER): Payer: Medicaid Other | Admitting: Internal Medicine

## 2020-09-17 ENCOUNTER — Encounter: Payer: Self-pay | Admitting: Internal Medicine

## 2020-09-17 ENCOUNTER — Ambulatory Visit: Payer: Medicaid Other | Admitting: Internal Medicine

## 2020-09-17 VITALS — BP 138/80 | HR 80 | Ht 65.0 in | Wt 287.4 lb

## 2020-09-17 DIAGNOSIS — K219 Gastro-esophageal reflux disease without esophagitis: Secondary | ICD-10-CM

## 2020-09-17 DIAGNOSIS — R1310 Dysphagia, unspecified: Secondary | ICD-10-CM | POA: Diagnosis not present

## 2020-09-17 NOTE — Progress Notes (Signed)
HISTORY OF PRESENT ILLNESS:  Claire Green is a 55 y.o. female, disabled due to anxiety disorder, who is sent by her primary care provider regarding chronic reflux disease.  I saw Claire Green November 2019 for routine screening colonoscopy.  Claire entire examination was normal.  Follow-up in 10 years recommended.  Green tells me that she has had problems with classic reflux manifested by regurgitation and pyrosis for many years.  Approximately 1 year ago she was prescribed omeprazole 20 mg daily.  She tells me that on medication her symptoms are well controlled.  After running out of her prescription, she discontinued her medication, particular since she was feeling well.  However, shortly thereafter, she had recurrent symptoms.  She finds symptoms severe and and affecting lifestyle.  She has also noticed that with reflux being active she will have significant intermittent solid food dysphagia.  Symptoms of dysphagia has improved on PPI therapy.  She is currently back on PPI therapy.  No other GI complaints.  She has not been vaccinated against COVID.  REVIEW OF SYSTEMS:  All non-GI ROS negative as otherwise stated in Claire HPI except for anxiety, arthritis, back pain, sinus and allergy, depression, fatigue, heart murmur, sleeping problems, ankle swelling, excessive urination, shortness of breath  Past Medical History:  Diagnosis Date  . Anemia   . Anxiety   . Apnea, sleep    Does not use CPAP every night but some nights  . Arthritis   . Arthritis, low back   . Bronchitis   . Depression   . GERD (gastroesophageal reflux disease)   . Hyperlipidemia   . Hypertension   . Morbid obesity ([place])   . Sleep apnea    On CPAP  . Strep throat     Past Surgical History:  Procedure Laterality Date  . ABDOMINAL HYSTERECTOMY Bilateral 06/16/2014   Procedure: SUPERCERVICAL HYSTERECTOMY ABDOMINAL, LEFT SALPINGO-OPPHERECTOMY, AND RIGHT SALPINGECTOMY;  Surgeon: Emily Filbert, MD;  Location: [facility];  Service:  Gynecology;  Laterality: Bilateral;  With bilateral salpingectomy  . BREAST EXCISIONAL BIOPSY Bilateral 1997  . BREAST SURGERY  1997   cyst removed bilat breast  . c section  1982, 1988, 1989   x [place]  . Upper teeth removed    . WISDOM TOOTH EXTRACTION      Social History JAYANA KOTULA  reports that she has never smoked. She has never used smokeless tobacco. She reports that she does not drink alcohol and does not use drugs.  family history includes Breast cancer (age of onset: 14) in her paternal aunt; Breast cancer (age of onset: 4) in her mother.  Allergies  Allergen Reactions  . Bactrim [Sulfamethoxazole-Trimethoprim] Hives  . Penicillins Rash    Childhood reaction, and thinks she had hives as young adult Has taken amoxicillin without a problem       PHYSICAL EXAMINATION: Vital signs: BP 138/80   Pulse 80   Ht 5\' 5"  (1.651 m)   Wt 287 lb 6.4 oz (130.4 kg)   LMP 05/23/2014   SpO2 98%   BMI 47.83 kg/m   Constitutional: Pleasant, markedly obese but otherwise generally well-appearing, no acute distress Psychiatric: alert and oriented x3, cooperative Eyes: extraocular movements intact, anicteric, conjunctiva pink Mouth: Mask Neck: supple no lymphadenopathy Cardiovascular: heart regular rate and rhythm, no murmur Lungs: clear to auscultation bilaterally Abdomen: soft, nontender, nondistended, no obvious ascites, no peritoneal signs, normal bowel sounds, no organomegaly Rectal: Omitted Extremities: no lower extremity  edema bilaterally Skin: no lesions on visible extremities Neuro: No focal deficits.  Cranial nerves intact  ASSESSMENT:  1.  Chronic GERD.  Response to PPI.  Requires PPI. 2.  Intermittent dysphagia with active GERD.  Likely esophageal swelling and inflammation. 3.  Morbid obesity 4.  Normal colonoscopy November 2019   PLAN:  1.  Reflux precautions 2.  Weight loss.  Claire importance of Claire stress 3.  Continue omeprazole 20 mg  daily.  Medication risks reviewed 4.  Schedule upper endoscopy to evaluate chronic GERD and dysphagia.  Consider dilation if peptic stricture identified.  Claire Green is HIGH RISK given her body habitus.  She will require monitored anesthesia care for her sedation.  Claire nature of Claire procedure, as well as Claire risks, benefits, and alternatives were carefully and thoroughly reviewed with Claire Green. Ample time for discussion and questions allowed. Claire Green understood, was satisfied, and agreed to proceed. 5.  Routine screening colonoscopy around November 2029

## 2020-09-17 NOTE — Patient Instructions (Signed)
If you are age 55 or older, your body mass index should be between 23-30. Your Body mass index is 47.83 kg/m. If this is out of the aforementioned range listed, please consider follow up with your Primary Care Provider.  If you are age 33 or younger, your body mass index should be between 19-25. Your Body mass index is 47.83 kg/m. If this is out of the aformentioned range listed, please consider follow up with your Primary Care Provider.   You have been scheduled for an endoscopy. Please follow written instructions given to you at your visit today. If you use inhalers (even only as needed), please bring them with you on the day of your procedure.

## 2020-09-21 ENCOUNTER — Other Ambulatory Visit: Payer: Self-pay | Admitting: Internal Medicine

## 2020-09-21 LAB — SARS CORONAVIRUS 2 (TAT 6-24 HRS): SARS Coronavirus 2: NEGATIVE

## 2020-09-23 ENCOUNTER — Encounter: Payer: Self-pay | Admitting: Internal Medicine

## 2020-09-23 ENCOUNTER — Other Ambulatory Visit: Payer: Self-pay

## 2020-09-23 ENCOUNTER — Ambulatory Visit (AMBULATORY_SURGERY_CENTER): Payer: Medicaid Other | Admitting: Internal Medicine

## 2020-09-23 VITALS — BP 130/65 | HR 68 | Temp 97.3°F | Resp 15 | Ht 65.0 in | Wt 287.0 lb

## 2020-09-23 DIAGNOSIS — K449 Diaphragmatic hernia without obstruction or gangrene: Secondary | ICD-10-CM

## 2020-09-23 DIAGNOSIS — R1310 Dysphagia, unspecified: Secondary | ICD-10-CM

## 2020-09-23 DIAGNOSIS — K222 Esophageal obstruction: Secondary | ICD-10-CM

## 2020-09-23 DIAGNOSIS — K219 Gastro-esophageal reflux disease without esophagitis: Secondary | ICD-10-CM | POA: Diagnosis not present

## 2020-09-23 MED ORDER — SODIUM CHLORIDE 0.9 % IV SOLN: 500.0000 mL | Freq: Once | INTRAVENOUS | Status: AC

## 2020-09-23 NOTE — Progress Notes (Signed)
A and O x3. Report to RN. Tolerated MAC anesthesia well.Teeth unchanged after procedure.

## 2020-09-23 NOTE — Patient Instructions (Addendum)
Handout given on post esophageal dilation diet, GERD, gastritis  YOU HAD AN ENDOSCOPIC PROCEDURE TODAY: Refer to the procedure report and other information in the discharge instructions given to you for any specific questions about what was found during the examination. If this information does not answer your questions, please call [facility] office at [phone] to clarify.   YOU SHOULD EXPECT: Some feelings of bloating in the abdomen. Passage of more gas than usual. Walking can help get rid of the air that was put into your GI tract during the procedure and reduce the bloating. If you had a lower endoscopy (such as a colonoscopy or flexible sigmoidoscopy) you may notice spotting of blood in your stool or on the toilet paper. Some abdominal soreness may be present for a day or two, also.  DIET: Your first meal following the procedure should be a light meal and then it is ok to progress to your normal diet. A half-sandwich or bowl of soup is an example of a good first meal. Heavy or fried foods are harder to digest and may make you feel nauseous or bloated. Drink plenty of fluids but you should avoid alcoholic beverages for 24 hours. If you had a esophageal dilation, please see attached instructions for diet.    ACTIVITY: Your care partner should take you home directly after the procedure. You should plan to take it easy, moving slowly for the rest of the day. You can resume normal activity the day after the procedure however YOU SHOULD NOT DRIVE, use power tools, machinery or perform tasks that involve climbing or major physical exertion for 24 hours (because of the sedation medicines used during the test).   SYMPTOMS TO REPORT IMMEDIATELY: A gastroenterologist can be reached at any hour. Please call [phone]  for any of the following symptoms:  .  Following upper endoscopy (EGD, EUS, ERCP, esophageal dilation) Vomiting of blood or coffee ground material  New, significant abdominal pain  New,  significant chest pain or pain under the shoulder blades  Painful or persistently difficult swallowing  New shortness of breath  Black, tarry-looking or red, bloody stools  FOLLOW UP:  If any biopsies were taken you will be contacted by phone or by letter within the next 1-3 weeks. Call [phone]  if you have not heard about the biopsies in 3 weeks.  Please also call with any specific questions about appointments or follow up tests.

## 2020-09-23 NOTE — Op Note (Signed)
[facility] Patient Name: Claire Green Procedure Date: 09/23/2020 8:19 AM MRN: [id number] Endoscopist: Docia Chuck. Henrene Pastor , MD Age: 55 Referring MD:  Date of Birth: 12/26/1965 Gender: Female Account #: [id number] Procedure:                Upper GI endoscopy with balloon down lesion of the                            esophagus (20 mm); biopsies Indications:              Dysphagia, Esophageal reflux. Symptoms improved on                            PPI Medicines:                Monitored Anesthesia Care Procedure:                Pre-Anesthesia Assessment:                           - Prior to the procedure, a History and Physical                            was performed, and patient medications and                            allergies were reviewed. The patient's tolerance of                            previous anesthesia was also reviewed. The risks                            and benefits of the procedure and the sedation                            options and risks were discussed with the patient.                            All questions were answered, and informed consent                            was obtained. Prior Anticoagulants: The patient has                            taken no previous anticoagulant or antiplatelet                            agents. ASA Grade Assessment: II - A patient with                            mild systemic disease. After reviewing the risks                            and benefits, the patient was deemed in  satisfactory condition to undergo the procedure.                           After obtaining informed consent, the endoscope was                            passed under direct vision. Throughout the                            procedure, the patient's blood pressure, pulse, and                            oxygen saturations were monitored continuously. The                            Endoscope was introduced through the  mouth, and                            advanced to the second part of duodenum. The upper                            GI endoscopy was accomplished without difficulty.                            The patient tolerated the procedure well. Scope In: Scope Out: Findings:                 One benign-appearing, ringlike, intrinsic moderate                            stenosis was found 38 cm from the incisors. This                            stenosis measured 1.4 cm (inner diameter). The                            stenosis was traversed. After completing the                            endoscopic survey, A TTS dilator was passed through                            the scope. Dilation with an 18-19-20 mm balloon                            dilator was performed to 20 mm. The ring was                            disrupted.                           The exam of the esophagus was otherwise normal.                           The  stomach revealed a small hiatal hernia. As                            well, very subtle linear antral erythema. Biopsies                            were taken with a cold forceps for Helicobacter                            pylori testing using CLOtest.                           The examined duodenum was normal.                           The cardia and gastric fundus were normal on                            retroflexion. Complications:            No immediate complications. Estimated Blood Loss:     Estimated blood loss: none. Estimated blood loss:                            none. Impression:               1. GERD.                           2. Esophageal stricture status post dilation                           3. Mild gastritis status post CLO biopsy. Recommendation:           1. Patient has a contact number available for                            emergencies. The signs and symptoms of potential                            delayed complications were discussed with the                             patient. Return to normal activities tomorrow.                            Written discharge instructions were provided to the                            patient.                           2. Post dilation diet                           3. Reflux precautions.  4. Continue present medications, including                            omeprazole daily.                           5. Await pathology results.                           6. Routine office follow-up with Dr. Henrene Pastor in 1                            year. Contact the office in the interim for any                            questions or problems. Docia Chuck. Henrene Pastor, MD 09/23/2020 9:28:45 AM This report has been signed electronically.

## 2020-09-23 NOTE — Progress Notes (Signed)
VS taken by N.S. 

## 2020-09-23 NOTE — Progress Notes (Signed)
Called to room to assist during endoscopic procedure.  Patient ID and intended procedure confirmed with present staff. Received instructions for my participation in the procedure from the performing physician.  

## 2020-09-24 LAB — HELICOBACTER PYLORI SCREEN-BIOPSY: UREASE: NEGATIVE

## 2020-09-27 ENCOUNTER — Telehealth: Payer: Self-pay | Admitting: *Deleted

## 2020-09-27 ENCOUNTER — Telehealth: Payer: Self-pay

## 2020-09-27 NOTE — Telephone Encounter (Signed)
  Follow up Call-  Call back number 09/23/2020 07/22/2018  Post procedure Call Back phone  # [phone]  Permission to leave phone message Yes Yes  Some recent data might be hidden     Patient questions:  Do you have a fever, pain , or abdominal swelling? No. Pain Score  0 *  Have you tolerated food without any problems? Yes.    Have you been able to return to your normal activities? Yes.    Do you have any questions about your discharge instructions: Diet   No. Medications  No. Follow up visit  No.  Do you have questions or concerns about your Care? No.  Actions: * If pain score is 4 or above: No action needed, pain <4.  1. Have you developed a fever since your procedure? no  2.   Have you had an respiratory symptoms (SOB or cough) since your procedure? no  3.   Have you tested positive for COVID 19 since your procedure no  4.   Have you had any family members/close contacts diagnosed with the COVID 19 since your procedure?  no   If yes to any of these questions please route to Joylene John, RN and Joella Prince, RN

## 2020-09-27 NOTE — Telephone Encounter (Signed)
First attempt, patient answered but hung up.  Will try again after lunch.

## 2020-10-01 ENCOUNTER — Telehealth: Payer: Self-pay | Admitting: Internal Medicine

## 2020-10-01 NOTE — Telephone Encounter (Signed)
Patient called states she has been feeling a pulling sensation in her stomach since the day after her procedure.

## 2020-10-01 NOTE — Telephone Encounter (Signed)
Pt states that since her EGD she has a "pulling sensation" at the bottom of her stomach. Reports it goes all across the bottom of her abdomen, states that it comes and goes. Pt wanted to know what Dr. Henrene Pastor recommends. Let pt know that he is out of the office today and will not be back until Monday. Sent to Dr. Henrene Pastor.

## 2020-10-02 NOTE — Telephone Encounter (Signed)
I do not know the cause of her complaint. I do NOT relate it to the procedure. If it is not severe give it time. If it is severe or persists for some time, I am happy to evaluate her in the office. Thanks

## 2020-10-04 NOTE — Telephone Encounter (Signed)
Spoke with pt and she is aware.

## 2021-01-13 ENCOUNTER — Other Ambulatory Visit: Payer: Self-pay | Admitting: Nurse Practitioner

## 2021-01-13 DIAGNOSIS — Z1231 Encounter for screening mammogram for malignant neoplasm of breast: Secondary | ICD-10-CM

## 2021-03-14 ENCOUNTER — Ambulatory Visit
Admission: RE | Admit: 2021-03-14 | Discharge: 2021-03-14 | Disposition: A | Discharge status: Home / Self Care | Payer: Medicaid Other | Source: Ambulatory Visit | Attending: Nurse Practitioner | Admitting: Nurse Practitioner

## 2021-03-14 ENCOUNTER — Other Ambulatory Visit: Payer: Self-pay

## 2021-03-14 DIAGNOSIS — Z1231 Encounter for screening mammogram for malignant neoplasm of breast: Secondary | ICD-10-CM

## 2021-09-14 ENCOUNTER — Encounter: Payer: Self-pay | Admitting: Internal Medicine

## 2022-02-13 ENCOUNTER — Other Ambulatory Visit: Payer: Self-pay | Admitting: Nurse Practitioner

## 2022-02-13 DIAGNOSIS — Z1231 Encounter for screening mammogram for malignant neoplasm of breast: Secondary | ICD-10-CM

## 2022-03-15 ENCOUNTER — Ambulatory Visit: Payer: Medicaid Other

## 2022-03-23 ENCOUNTER — Ambulatory Visit
Admission: RE | Admit: 2022-03-23 | Discharge: 2022-03-23 | Disposition: A | Discharge status: Home / Self Care | Payer: Medicaid Other | Source: Ambulatory Visit | Attending: Nurse Practitioner | Admitting: Nurse Practitioner

## 2022-03-23 DIAGNOSIS — Z1231 Encounter for screening mammogram for malignant neoplasm of breast: Secondary | ICD-10-CM

## 2022-07-22 ENCOUNTER — Encounter (HOSPITAL_COMMUNITY): Payer: Self-pay | Admitting: Emergency Medicine

## 2022-07-22 ENCOUNTER — Ambulatory Visit (HOSPITAL_COMMUNITY)
Admission: EM | Admit: 2022-07-22 | Discharge: 2022-07-22 | Disposition: A | Discharge status: Home / Self Care | Payer: Medicaid Other | Attending: Emergency Medicine | Admitting: Emergency Medicine

## 2022-07-22 DIAGNOSIS — R21 Rash and other nonspecific skin eruption: Secondary | ICD-10-CM | POA: Diagnosis not present

## 2022-07-22 MED ORDER — TRIAMCINOLONE ACETONIDE 0.5 % EX OINT: TOPICAL_OINTMENT | CUTANEOUS | 0 refills | Status: AC

## 2022-07-22 NOTE — ED Provider Notes (Signed)
[facility]    CSN: [id number] Arrival date & time: 07/22/22  1604    HISTORY   Chief Complaint  Patient presents with   Insect Bite   HPI Claire Green is a pleasant, 56 y.o. female who presents to urgent care today. Patient complains of multiple insect bites on her right upper arm near her elbow 2 days ago.  States she noticed another bite on the back of her right lower arm near her elbow today.  Patient states has been taking Benadryl and applying over-the-counter hydrocortisone cream with no relief of the itching.  Patient states that she has scratched some of them so much that they are now deroofed and draining clear fluid.  Patient states lesions are not painful, just especially itchy.  The history is provided by the patient.   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   Past Surgical History:  Procedure Laterality Date   ABDOMINAL HYSTERECTOMY Bilateral 06/16/2014   Procedure: SUPERCERVICAL HYSTERECTOMY ABDOMINAL, LEFT SALPINGO-OPPHERECTOMY, AND RIGHT SALPINGECTOMY;  Surgeon: Emily Filbert, MD;  Location: [facility];  Service: Gynecology;  Laterality: Bilateral;  With bilateral salpingectomy   BREAST EXCISIONAL BIOPSY Bilateral [place]   cyst removed bilat breast   c section  1982, 1988, 1989   x 3   TUBAL LIGATION  1989   Upper teeth removed     WISDOM TOOTH EXTRACTION     OB History     Gravida  3   Para  3   Term      Preterm      AB       Living  3      SAB      IAB      Ectopic      Multiple      Live Births             Home Medications    Prior to Admission medications   Medication Sig Start Date End Date Taking? Authorizing Provider  triamcinolone ointment (KENALOG) 0.5 % Apply to affected areas of the body twice daily until rash has resolved.  Do not apply this ointment to the face or to the genital area. 07/22/22  Yes Lynden Oxford Scales, PA-C  atorvastatin (LIPITOR) 20 MG tablet Take 1 tablet by mouth once daily 01/22/19   [provider]  baclofen (LIORESAL) 10 MG tablet Take 10 mg by mouth 3 (three) times daily.    [provider]  diclofenac (VOLTAREN) 75 MG EC tablet Take 75 mg by mouth 2 (two) times daily.    [provider]  gabapentin (NEURONTIN) 400 MG capsule Take 400 mg by mouth every evening.    [provider]  hydrOXYzine (VISTARIL) 25 MG capsule Take 75 mg by mouth at bedtime.    [provider]  lisinopril-hydrochlorothiazide (PRINZIDE,ZESTORETIC) 20-25 MG per tablet Take 1 tablet by mouth daily.     [provider]  metFORMIN (GLUCOPHAGE-XR) 750 MG 24 hr tablet Take 750 mg by mouth daily with breakfast.    [provider]  sertraline (ZOLOFT) 100 MG tablet Take 150 mg by mouth daily.    [provider]    Family History Family History  Problem Relation Age of Onset   Breast cancer Mother 47   Breast cancer Paternal Aunt 58   Colon cancer Neg Hx    Esophageal cancer Neg Hx    Rectal cancer Neg Hx    Stomach cancer Neg Hx    Social History Social History   Tobacco Use   Smoking status: Never   Smokeless tobacco: Never  Vaping Use   Vaping Use: Never used  Substance Use Topics   Alcohol use: No   Drug use: No   Allergies   Bactrim [sulfamethoxazole-trimethoprim] and Penicillins  Review of Systems Review of Systems Pertinent findings revealed after performing a 14 point review of systems has  been noted in the history of present illness.  Physical Exam Triage Vital Signs ED Triage Vitals  Enc Vitals Group     BP 06/24/21 0827 (!) 147/82     Pulse Rate 06/24/21 0827 72     Resp 06/24/21 0827 18     Temp 06/24/21 0827 98.3 F (36.8 C)     Temp Source 06/24/21 0827 Oral     SpO2 06/24/21 0827 98 %     Weight --      Height --      Head Circumference --      Peak Flow --      Pain Score 06/24/21 0826 5     Pain Loc --      Pain Edu? --      Excl. in [place]? --   No data found.  Updated Vital Signs BP (!) 167/78 (BP Location: Left Arm)   Pulse 64   Temp 98.3 F (36.8 C) (Oral)   Resp 18   LMP 05/18/2014   SpO2 94%   Physical Exam Vitals and nursing note reviewed.  Constitutional:      General: She is not in acute distress.    Appearance: Normal appearance.  HENT:     Head: Normocephalic and atraumatic.  Eyes:     Pupils: Pupils are equal, round, and reactive to light.  Cardiovascular:     Rate and Rhythm: Normal rate and regular rhythm.  Pulmonary:     Effort: Pulmonary effort is normal.     Breath sounds: Normal breath sounds.  Musculoskeletal:        General: Normal range of motion.     Cervical back: Normal range of motion and neck supple.  Skin:    General: Skin is warm and dry.     Findings: Lesion (10-14 papular lesions on lateral aspect of right upper arm proximal to elbow with surrounding erythema and excoriation, similar single lesion is present on posterior lower arm proximal to elbow) present.  Neurological:     General: No focal deficit present.     Mental Status: She is alert and oriented to person, place, and time. Mental status is at baseline.  Psychiatric:        Mood and Affect: Mood normal.        Behavior: Behavior normal.        Thought Content: Thought content normal.  Judgment: Judgment normal.     Visual Acuity Right Eye Distance:   Left Eye Distance:   Bilateral Distance:    Right Eye Near:   Left Eye Near:     Bilateral Near:     UC Couse / Diagnostics / Procedures:     Radiology No results found.  Procedures Procedures (including critical care time) EKG  Pending results:  Labs Reviewed - No data to display  Medications Ordered in UC: Medications - No data to display  UC Diagnoses / Final Clinical Impressions(s)   I have reviewed the triage vital signs and the nursing notes.  Pertinent labs & imaging results that were available during my care of the patient were reviewed by me and considered in my medical decision making (see chart for details).    Final diagnoses:  Rash and nonspecific skin eruption   Patient provided with triamcinolone 0.5 to apply sparingly twice daily to individual lesions and advised to continue Benadryl.  Patient advised to monitor rash closely for signs of discoloration and to discontinue triamcinolone if this occurs.  Patient advised to follow-up in a few days if no improvement or if the appearance of the lesions changes in any way for further evaluation.  ED Prescriptions     Medication Sig Dispense Auth. Provider   triamcinolone ointment (KENALOG) 0.5 % Apply to affected areas of the body twice daily until rash has resolved.  Do not apply this ointment to the face or to the genital area. 80 g Lynden Oxford Scales, PA-C      PDMP not reviewed this encounter.  Pending results:  Labs Reviewed - No data to display  Discharge Instructions:   Discharge Instructions      I apologize that I am unable to tell you exactly what is causing this rash on your arm.  I do know that high-dose topical steroid will significantly reduce the inflammation and itching within a very short period of time.  I do not think this rash is contagious.  I sent prescription for triamcinolone 0.5% ointment to see.  Please apply a small amount to each lesion twice daily until itching has resolved.  Please monitor your skin closely for signs of lightening in color, discontinue  immediately if this occurs.  If the rash persists despite treatment, or if new symptoms develop, please feel free to return to urgent care for further evaluation or follow-up with your primary care provider.    Disposition Upon Discharge:  Condition: stable for discharge home  Patient presented with an acute illness with associated systemic symptoms and significant discomfort requiring urgent management. In my opinion, this is a condition that a prudent lay person (someone who possesses an average knowledge of health and medicine) may potentially expect to result in complications if not addressed urgently such as respiratory distress, impairment of bodily function or dysfunction of bodily organs.   Routine symptom specific, illness specific and/or disease specific instructions were discussed with the patient and/or caregiver at length.   As such, the patient has been evaluated and assessed, work-up was performed and treatment was provided in alignment with urgent care protocols and evidence based medicine.  Patient/parent/caregiver has been advised that the patient may require follow up for further testing and treatment if the symptoms continue in spite of treatment, as clinically indicated and appropriate.  Patient/parent/caregiver has been advised to return to the [facility] or PCP if no better; to PCP or the Emergency Department if new signs and symptoms develop, or if  the current signs or symptoms continue to change or worsen for further workup, evaluation and treatment as clinically indicated and appropriate  The patient will follow up with their current PCP if and as advised. If the patient does not currently have a PCP we will assist them in obtaining one.   The patient may need specialty follow up if the symptoms continue, in spite of conservative treatment and management, for further workup, evaluation, consultation and treatment as clinically indicated and  appropriate.   Patient/parent/caregiver verbalized understanding and agreement of plan as discussed.  All questions were addressed during visit.  Please see discharge instructions below for further details of plan.  This office note has been dictated using Museum/gallery curator.  Unfortunately, this method of dictation can sometimes lead to typographical or grammatical errors.  I apologize for your inconvenience in advance if this occurs.  Please do not hesitate to reach out to me if clarification is needed.      Lynden Oxford Scales, [place] 07/22/22 [phone]

## 2022-07-22 NOTE — ED Triage Notes (Signed)
Pt reports an insect bite on the right arm 2 days ago. States the bite has since then spread. Has taken Benadryl and hydrocortisone.

## 2022-07-22 NOTE — Discharge Instructions (Addendum)
I apologize that I am unable to tell you exactly what is causing this rash on your arm.  I do know that high-dose topical steroid will significantly reduce the inflammation and itching within a very short period of time.  I do not think this rash is contagious.  I sent prescription for triamcinolone 0.5% ointment to see.  Please apply a small amount to each lesion twice daily until itching has resolved.  Please monitor your skin closely for signs of lightening in color, discontinue immediately if this occurs.  If the rash persists despite treatment, or if new symptoms develop, please feel free to return to urgent care for further evaluation or follow-up with your primary care provider.

## 2022-11-16 ENCOUNTER — Ambulatory Visit: Payer: Medicaid Other | Admitting: Physician Assistant

## 2023-01-11 ENCOUNTER — Ambulatory Visit: Payer: Medicaid Other | Admitting: Internal Medicine

## 2023-03-08 HISTORY — PX: LAPAROSCOPIC GASTRIC SLEEVE RESECTION: SHX5895

## 2023-03-27 ENCOUNTER — Other Ambulatory Visit: Payer: Self-pay | Admitting: Physician Assistant

## 2023-03-27 DIAGNOSIS — Z0000 Encounter for general adult medical examination without abnormal findings: Secondary | ICD-10-CM

## 2023-04-02 ENCOUNTER — Ambulatory Visit
Admission: RE | Admit: 2023-04-02 | Discharge: 2023-04-02 | Disposition: A | Discharge status: Home / Self Care | Payer: Medicaid Other | Source: Ambulatory Visit | Attending: Physician Assistant | Admitting: Physician Assistant

## 2023-04-02 DIAGNOSIS — Z0000 Encounter for general adult medical examination without abnormal findings: Secondary | ICD-10-CM

## 2023-04-13 ENCOUNTER — Ambulatory Visit (INDEPENDENT_AMBULATORY_CARE_PROVIDER_SITE_OTHER): Payer: Medicaid Other | Admitting: Internal Medicine

## 2023-04-13 ENCOUNTER — Encounter: Payer: Self-pay | Admitting: Internal Medicine

## 2023-04-13 VITALS — BP 136/80 | HR 72 | Ht 64.0 in | Wt 239.4 lb

## 2023-04-13 DIAGNOSIS — K219 Gastro-esophageal reflux disease without esophagitis: Secondary | ICD-10-CM | POA: Diagnosis not present

## 2023-04-13 DIAGNOSIS — K222 Esophageal obstruction: Secondary | ICD-10-CM | POA: Diagnosis not present

## 2023-04-13 NOTE — Patient Instructions (Signed)
Please follow up in one year.  _______________________________________________________  If your blood pressure at your visit was 140/90 or greater, please contact your primary care physician to follow up on this.  _______________________________________________________  If you are age 57 or older, your body mass index should be between 23-30. Your Body mass index is 41.09 kg/m. If this is out of the aforementioned range listed, please consider follow up with your Primary Care Provider.  If you are age 8 or younger, your body mass index should be between 19-25. Your Body mass index is 41.09 kg/m. If this is out of the aformentioned range listed, please consider follow up with your Primary Care Provider.   ________________________________________________________  The [facility] providers would like to encourage you to use [facility] to communicate with providers for non-urgent requests or questions.  Due to long hold times on the telephone, sending your provider a message by [facility] may be a faster and more efficient way to get a response.  Please allow 48 business hours for a response.  Please remember that this is for non-urgent requests.  _______________________________________________________

## 2023-04-13 NOTE — Progress Notes (Signed)
HISTORY OF PRESENT ILLNESS:  Claire Green is a 57 y.o. female, disabled due to anxiety disorder, who presents today for follow-up regarding chronic GERD complicated by peptic stricture requiring esophageal dilation.  I saw the patient previously November 2019 for routine screening colonoscopy which was normal.  She was subsequently seen in this office September 17, 2020 regarding chronic GERD, intermittent dysphagia, and obesity.  She was told to continue omeprazole 20 mg daily, adhere to reflux precautions, and scheduled for upper endoscopy.  Upper endoscopy was performed September 23, 2020.  She was found to have a ringlike peptic stricture which was dilated up to 20 mm with a balloon.  She was to continue on PPI and follow-up in 1 year.  She follows up at this time.  Patient tells me that since her dilation her dysphagia has been resolved.  She has been compliant with daily PPI therapy.  She had been on omeprazole 20 mg daily.  Last month she underwent successful Roux-en-Y gastric bypass surgery in [place].  At that time they increased her omeprazole to 40 mg daily.  On medication she has no reflux symptoms or recurrent dysphagia.  She has a number of questions about her condition.  The patient also underwent routine screening colonoscopy November 2019.  Examination was normal    REVIEW OF SYSTEMS:  All non-GI ROS negative unless otherwise stated in the HPI except for anxiety, arthritis, back pain, depression, fatigue, muscle cramps  Past Medical History:  Diagnosis Date   Anemia    Anxiety    Apnea, sleep    Does not use CPAP every night but some nights   Arthritis    Arthritis, low back    Bronchitis    Depression    GERD (gastroesophageal reflux disease)    Hyperlipidemia    Hypertension    Morbid obesity (HCC)    Sleep apnea    On CPAP   Strep throat     Past Surgical History:  Procedure Laterality Date   ABDOMINAL HYSTERECTOMY Bilateral 06/16/2014   Procedure:  SUPERCERVICAL HYSTERECTOMY ABDOMINAL, LEFT SALPINGO-OPPHERECTOMY, AND RIGHT SALPINGECTOMY;  Surgeon: Allie Bossier, MD;  Location: [facility];  Service: Gynecology;  Laterality: Bilateral;  With bilateral salpingectomy   BREAST EXCISIONAL BIOPSY Bilateral 1997   BREAST SURGERY  1997   cyst removed bilat breast   c section  1982, 1988, 1989   x 3   TUBAL LIGATION  1989   Upper teeth removed     WISDOM TOOTH EXTRACTION      Social History Claire Green  reports that she has never smoked. She has never used smokeless tobacco. She reports that she does not drink alcohol and does not use drugs.  family history includes Breast cancer (age of onset: 8) in her paternal aunt; Breast cancer (age of onset: 34) in her mother.  Allergies  Allergen Reactions   Bactrim [Sulfamethoxazole-Trimethoprim] Hives   Sulfa Antibiotics    Penicillins Rash    Childhood reaction, and thinks she had hives as young adult Has taken amoxicillin without a problem       PHYSICAL EXAMINATION: Vital signs: BP 136/80 (BP Location: Left Arm, Patient Position: Sitting, Cuff Size: Large)   Pulse 72   Ht 5\' 4"  (1.626 m) Comment: height measured without shoes  Wt 239 lb 6 oz (108.6 kg)   LMP 05/18/2014   BMI 41.09 kg/m   Constitutional: generally well-appearing, no acute distress Psychiatric: alert and oriented x3, cooperative Eyes: extraocular movements intact,  anicteric, conjunctiva pink Mouth: oral pharynx moist, no lesions Neck: supple no lymphadenopathy Cardiovascular: heart regular rate and rhythm, no murmur Lungs: clear to auscultation bilaterally Abdomen: soft, obese, nontender, nondistended, no obvious ascites, no peritoneal signs, normal bowel sounds, no organomegaly Rectal: Omitted Extremities: no clubbing, cyanosis, or lower extremity edema bilaterally Skin: no lesions on visible extremities Neuro: No focal deficits.  Cranial nerves intact  ASSESSMENT:  1.  GERD complicated by peptic stricture.   Asymptomatic post dilation (January 2022) on PPI (omeprazole 40 mg daily).  Long discussion today on reflux disease, his complications, and its management 2.  Morbid obesity.  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  Colon cancer screening.  Up-to-date.  Normal colonoscopy November 2019   PLAN:  1.  Reflux precautions 2.  Continue omeprazole 40 mg daily 3.  Surveillance colonoscopy around 2019 4.  Routine office follow-up 1 year.  He has been instructed to contact the office in the interim for any questions or problems, such as recurrent dysphagia A total time of 30 minutes was spent preparing to see the patient, obtaining comprehensive history, performing medically appropriate physical examination, counseling and educating the patient regarding the above listed issues, directing medical therapy, defining follow-up interval, and documenting clinical information in the health record

## 2024-02-04 ENCOUNTER — Ambulatory Visit: Admitting: Internal Medicine

## 2024-02-06 ENCOUNTER — Encounter: Payer: Self-pay | Admitting: Internal Medicine

## 2024-02-06 ENCOUNTER — Ambulatory Visit (INDEPENDENT_AMBULATORY_CARE_PROVIDER_SITE_OTHER): Admitting: Internal Medicine

## 2024-02-06 VITALS — BP 136/80 | HR 79 | Ht 64.0 in | Wt 228.0 lb

## 2024-02-06 DIAGNOSIS — K219 Gastro-esophageal reflux disease without esophagitis: Secondary | ICD-10-CM

## 2024-02-06 DIAGNOSIS — R1319 Other dysphagia: Secondary | ICD-10-CM

## 2024-02-06 DIAGNOSIS — R09A2 Foreign body sensation, throat: Secondary | ICD-10-CM

## 2024-02-06 DIAGNOSIS — K222 Esophageal obstruction: Secondary | ICD-10-CM

## 2024-02-06 DIAGNOSIS — Z9884 Bariatric surgery status: Secondary | ICD-10-CM

## 2024-02-06 DIAGNOSIS — Z903 Acquired absence of stomach [part of]: Secondary | ICD-10-CM

## 2024-02-06 DIAGNOSIS — R1310 Dysphagia, unspecified: Secondary | ICD-10-CM | POA: Diagnosis not present

## 2024-02-06 NOTE — Progress Notes (Signed)
 HISTORY OF PRESENT ILLNESS:  Claire Green is a 58 y.o. female with past medical history as listed below who has been followed in this office for GERD complicated by peptic stricture and colon cancer screening.  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  Patient presents today with her family regarding problems with dysphagia.  The patient was last seen in this office April 13, 2019 for for routine follow-up.  At that time she was to continue omeprazole  40 mg daily and follow-up in 1 year.  Patient tells me that over the past several months he has noticed swallowing difficulty, particularly with pills.  She points to the cervical esophagus.  Describes a globus sensation.  Several months ago her PCP told her to decrease her PPI to every other day.  Lower dose (currently 20 mg every other day).  She has had about 60 pound weight loss since her sleeve gastrectomy.  No significant breakthrough reflux.  Last colonoscopy November 2019 was normal    REVIEW OF SYSTEMS:  All non-GI ROS negative.  Past Medical History:  Diagnosis Date   Anemia    Anxiety    Apnea, sleep    Does not use CPAP every night but some nights   Arthritis    Arthritis, low back    Bronchitis    Depression    GERD (gastroesophageal reflux disease)    Hyperlipidemia    Hypertension    Morbid obesity (HCC)    Sleep apnea    On CPAP   Strep throat     Past Surgical History:  Procedure Laterality Date   ABDOMINAL HYSTERECTOMY Bilateral 06/16/2014   Procedure: SUPERCERVICAL HYSTERECTOMY ABDOMINAL, LEFT SALPINGO-OPPHERECTOMY, AND RIGHT SALPINGECTOMY;  Surgeon: Ana Balling, MD;  Location: [facility];  Service: Gynecology;  Laterality: Bilateral;  With bilateral salpingectomy   BREAST EXCISIONAL BIOPSY Bilateral 1997   BREAST SURGERY  1997   cyst removed bilat breast   c section  1982, 1988, 1989   x 3   TUBAL LIGATION  1989   Upper teeth removed     WISDOM TOOTH EXTRACTION      Social History Claire Green   reports that she has never smoked. She has never used smokeless tobacco. She reports that she does not drink alcohol and does not use drugs.  family history includes Breast cancer (age of onset: 8) in her paternal aunt; Breast cancer (age of onset: 96) in her mother.  Allergies  Allergen Reactions   Bactrim [Sulfamethoxazole-Trimethoprim] Hives   Sulfa Antibiotics    Penicillins Rash    Childhood reaction, and thinks she had hives as young adult Has taken amoxicillin  without a problem       PHYSICAL EXAMINATION: Vital signs: BP 136/80   Pulse 79   Ht 5' 4 (1.626 m)   Wt 228 lb (103.4 kg)   LMP 05/18/2014   BMI 39.14 kg/m   Constitutional: generally well-appearing, no acute distress Psychiatric: alert and oriented x3, cooperative Eyes: extraocular movements intact, anicteric, conjunctiva pink Mouth: oral pharynx moist, no lesions Neck: supple no lymphadenopathy Cardiovascular: heart regular rate and rhythm, no murmur Lungs: clear to auscultation bilaterally Abdomen: soft, nontender, nondistended, no obvious ascites, no peritoneal signs, normal bowel sounds, no organomegaly Rectal: Omitted Extremities: no clubbing, cyanosis, or lower extremity edema bilaterally Skin: no lesions on visible extremities Neuro: No focal deficits.  Cranial nerves intact  ASSESSMENT:  1.  Dysphagia.  Rule out recurrent peptic stricture.  The patient points to the  cervical esophageal region, rule out hypertensive cricopharyngeus.  She also describes globus type sensation.  May be related to reflux with marked reduction of PPI by PCP. 2.  GERD complicated by peptic stricture status post esophageal dilation (balloon 20 mm) January 2022 3.  Normal colonoscopy November 2019 4.  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  Advised to take her omeprazole  20 mg DAILY. 2.  Reflux precautions 3.  Schedule upper endoscopy with esophageal dilation.The nature of the procedure, as well as the  risks, benefits, and alternatives were carefully and thoroughly reviewed with the patient. Ample time for discussion and questions allowed. The patient understood, was satisfied, and agreed to proceed. 4.  Office follow-up after the above.  If still having swallowing issues, that esophagram thereafter with tablet. Thank A total time of 40 minutes was spent preparing to see the patient, obtaining comprehensive history, performing medically appropriate physical exam, counseling and educating the patient regarding above listed issues, adjusting medication, ordering therapeutic endoscopic procedure, and documenting clinical information in the health record

## 2024-02-06 NOTE — Patient Instructions (Signed)
 You have been scheduled for an endoscopy. Please follow written instructions given to you at your visit today.  If you use inhalers (even only as needed), please bring them with you on the day of your procedure.  If you take any of the following medications, they will need to be adjusted prior to your procedure:   DO NOT TAKE 7 DAYS PRIOR TO TEST- Trulicity (dulaglutide) Ozempic, Wegovy (semaglutide) Mounjaro (tirzepatide) Bydureon Bcise (exanatide extended release)  DO NOT TAKE 1 DAY PRIOR TO YOUR TEST Rybelsus (semaglutide) Adlyxin (lixisenatide) Victoza (liraglutide) Byetta (exanatide) ___________________________________________________________________________  _______________________________________________________  If your blood pressure at your visit was 140/90 or greater, please contact your primary care physician to follow up on this.  _______________________________________________________  If you are age 59 or older, your body mass index should be between 23-30. Your Body mass index is 39.14 kg/m. If this is out of the aforementioned range listed, please consider follow up with your Primary Care Provider.  If you are age 31 or younger, your body mass index should be between 19-25. Your Body mass index is 39.14 kg/m. If this is out of the aformentioned range listed, please consider follow up with your Primary Care Provider.   ________________________________________________________  The [facility] providers would like to encourage you to use MYCHART to communicate with providers for non-urgent requests or questions.  Due to long hold times on the telephone, sending your provider a message by [facility] may be a faster and more efficient way to get a response.  Please allow 48 business hours for a response.  Please remember that this is for non-urgent requests.  _______________________________________________________

## 2024-03-04 ENCOUNTER — Encounter: Payer: Self-pay | Admitting: Internal Medicine

## 2024-03-04 ENCOUNTER — Ambulatory Visit: Admitting: Internal Medicine

## 2024-03-04 VITALS — BP 146/81 | HR 56 | Temp 97.9°F | Resp 9 | Ht 64.0 in | Wt 228.0 lb

## 2024-03-04 DIAGNOSIS — K222 Esophageal obstruction: Secondary | ICD-10-CM

## 2024-03-04 DIAGNOSIS — Z9884 Bariatric surgery status: Secondary | ICD-10-CM

## 2024-03-04 DIAGNOSIS — R1319 Other dysphagia: Secondary | ICD-10-CM

## 2024-03-04 DIAGNOSIS — K219 Gastro-esophageal reflux disease without esophagitis: Secondary | ICD-10-CM

## 2024-03-04 MED ORDER — SODIUM CHLORIDE 0.9 % IV SOLN: 500.0000 mL | Freq: Once | INTRAVENOUS | Status: DC

## 2024-03-04 NOTE — Progress Notes (Signed)
 Expand All Collapse All HISTORY OF PRESENT ILLNESS:   Claire Green is a 58 y.o. female with past medical history as listed below who has been followed in this office for GERD complicated by peptic stricture and colon cancer screening.  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  Patient presents today with her family regarding problems with dysphagia.   The patient was last seen in this office April 13, 2019 for for routine follow-up.  At that time she was to continue omeprazole  40 mg daily and follow-up in 1 year.  Patient tells me that over the past several months he has noticed swallowing difficulty, particularly with pills.  She points to the cervical esophagus.  Describes a globus sensation.  Several months ago her PCP told her to decrease her PPI to every other day.  Lower dose (currently 20 mg every other day).  She has had about 60 pound weight loss since her sleeve gastrectomy.  No significant breakthrough reflux.   Last colonoscopy November 2019 was normal       REVIEW OF SYSTEMS:   All non-GI ROS negative.       Past Medical History:  Diagnosis Date   Anemia     Anxiety     Apnea, sleep      Does not use CPAP every night but some nights   Arthritis     Arthritis, low back     Bronchitis     Depression     GERD (gastroesophageal reflux disease)     Hyperlipidemia     Hypertension     Morbid obesity (HCC)     Sleep apnea      On CPAP   Strep throat                 Past Surgical History:  Procedure Laterality Date   ABDOMINAL HYSTERECTOMY Bilateral 06/16/2014    Procedure: SUPERCERVICAL HYSTERECTOMY ABDOMINAL, LEFT SALPINGO-OPPHERECTOMY, AND RIGHT SALPINGECTOMY;  Surgeon: Harland JAYSON Birkenhead, MD;  Location: [facility];  Service: Gynecology;  Laterality: Bilateral;  With bilateral salpingectomy   BREAST EXCISIONAL BIOPSY Bilateral 1997   BREAST SURGERY   1997    cyst removed bilat breast   c section   1982, 1988, 1989    x 3   TUBAL LIGATION   1989   Upper teeth  removed       WISDOM TOOTH EXTRACTION              Social History Claire Green  reports that she has never smoked. She has never used smokeless tobacco. She reports that she does not drink alcohol and does not use drugs.   family history includes Breast cancer (age of onset: 57) in her paternal aunt; Breast cancer (age of onset: 71) in her mother.   Allergies       Allergies  Allergen Reactions   Bactrim [Sulfamethoxazole-Trimethoprim] Hives   Sulfa Antibiotics     Penicillins Rash      Childhood reaction, and thinks she had hives as young adult Has taken amoxicillin  without a problem            PHYSICAL EXAMINATION: Vital signs: BP 136/80   Pulse 79   Ht 5' 4 (1.626 m)   Wt 228 lb (103.4 kg)   LMP 05/18/2014   BMI 39.14 kg/m   Constitutional: generally well-appearing, no acute distress Psychiatric: alert and oriented x3, cooperative Eyes: extraocular movements intact, anicteric, conjunctiva pink Mouth: oral pharynx moist, no lesions  Neck: supple no lymphadenopathy Cardiovascular: heart regular rate and rhythm, no murmur Lungs: clear to auscultation bilaterally Abdomen: soft, nontender, nondistended, no obvious ascites, no peritoneal signs, normal bowel sounds, no organomegaly Rectal: Omitted Extremities: no clubbing, cyanosis, or lower extremity edema bilaterally Skin: no lesions on visible extremities Neuro: No focal deficits.  Cranial nerves intact   ASSESSMENT:   1.  Dysphagia.  Rule out recurrent peptic stricture.  The patient points to the cervical esophageal region, rule out hypertensive cricopharyngeus.  She also describes globus type sensation.  May be related to reflux with marked reduction of PPI by PCP. 2.  GERD complicated by peptic stricture status post esophageal dilation (balloon 20 mm) January 2022 3.  Normal colonoscopy November 2019 4.  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  Advised to take her omeprazole  20 mg DAILY. 2.   Reflux precautions 3.  Schedule upper endoscopy with esophageal dilation.The nature of the procedure, as well as the risks, benefits, and alternatives were carefully and thoroughly reviewed with the patient. Ample time for discussion and questions allowed. The patient understood, was satisfied, and agreed to proceed. 4.  Office follow-up after the above.  If still having swallowing issues, that esophagram thereafter with tablet.

## 2024-03-04 NOTE — Progress Notes (Signed)
 Sedate, gd SR, tolerated procedure well, VSS, report to RN

## 2024-03-04 NOTE — Op Note (Signed)
 [facility] Patient Name: Claire Green Procedure Date: 03/04/2024 4:04 PM MRN: [id number] Endoscopist: Norleen SAILOR. Abran , MD, [id number] Age: 58 Referring MD:  Date of Birth: Nov 23, 1965 Gender: Female Account #: [id number] Procedure:                Upper GI endoscopy with balloon dilation of the                            esophagus. 20 mm max Indications:              Therapeutic procedure, Dysphagia, Esophageal reflux Medicines:                Monitored Anesthesia Care Procedure:                Pre-Anesthesia Assessment:                           - Prior to the procedure, a History and Physical                            was performed, and patient medications and                            allergies were reviewed. The patient's tolerance of                            previous anesthesia was also reviewed. The risks                            and benefits of the procedure and the sedation                            options and risks were discussed with the patient.                            All questions were answered, and informed consent                            was obtained. Prior Anticoagulants: The patient has                            taken no anticoagulant or antiplatelet agents. ASA                            Grade Assessment: II - A patient with mild systemic                            disease. After reviewing the risks and benefits,                            the patient was deemed in satisfactory condition to                            undergo the procedure.  After obtaining informed consent, the endoscope was                            passed under direct vision. Throughout the                            procedure, the patient's blood pressure, pulse, and                            oxygen saturations were monitored continuously. The                            Olympus scope [id number] was introduced through the                             mouth, and advanced to the second part of duodenum.                            The upper GI endoscopy was accomplished without                            difficulty. The patient tolerated the procedure                            well. Scope In: Scope Out: Findings:                 The esophagus revealed a large caliber distal                            esophageal stricture measuring 15 mm. Question                            partial web in the proximal esophagus. After                            completing the endoscopic survey, A TTS dilator was                            passed through the scope. Dilation with an 18-19-20                            mm balloon dilator was performed to 20 mm. No                            significant resistance. No heme                           The stomach revealed evidence of prior sleeve                            gastrectomy. Otherwise normal.                           The examined duodenum was normal.  The cardia and gastric fundus were normal on                            retroflexion. Complications:            No immediate complications. Estimated Blood Loss:     Estimated blood loss: none. Impression:               1. Distal esophageal stricture status post balloon                            dilation to 20 mm                           2. 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 Status post sleeve gastrectomy. Recommendation:           - Patient has a contact number available for                            emergencies. The signs and symptoms of potential                            delayed complications were discussed with the                            patient. Return to normal activities tomorrow.                            Written discharge instructions were provided to the                            patient.                           - Post dilation diet.                           -  Continue present medications. Continue omeprazole                             DAILY                           - Office follow-up with Dr. Abran in 8 weeks Norleen SAILOR. Abran, MD 03/04/2024 4:21:09 PM This report has been signed electronically.

## 2024-03-04 NOTE — Progress Notes (Signed)
 Pt's states no medical or surgical changes since previsit or office visit.

## 2024-03-04 NOTE — Patient Instructions (Signed)

## 2024-03-04 NOTE — Progress Notes (Signed)
 Called to room to assist during endoscopic procedure.  Patient ID and intended procedure confirmed with present staff. Received instructions for my participation in the procedure from the performing physician.

## 2024-03-05 ENCOUNTER — Telehealth: Payer: Self-pay

## 2024-03-05 NOTE — Telephone Encounter (Signed)
  Follow up Call-     03/04/2024    3:09 PM  Call back number  Post procedure Call Back phone  # [phone]  Permission to leave phone message Yes    Left message

## 2024-05-05 ENCOUNTER — Ambulatory Visit: Admitting: Internal Medicine

## 2024-05-14 ENCOUNTER — Other Ambulatory Visit: Payer: Self-pay | Admitting: Physician Assistant

## 2024-05-14 DIAGNOSIS — Z1231 Encounter for screening mammogram for malignant neoplasm of breast: Secondary | ICD-10-CM

## 2024-06-04 ENCOUNTER — Ambulatory Visit

## 2024-06-20 ENCOUNTER — Ambulatory Visit

## 2024-06-27 ENCOUNTER — Ambulatory Visit: Admitting: Physician Assistant

## 2024-07-04 ENCOUNTER — Ambulatory Visit
Admission: RE | Admit: 2024-07-04 | Discharge: 2024-07-04 | Disposition: A | Discharge status: Home / Self Care | Source: Ambulatory Visit | Attending: Physician Assistant | Admitting: Physician Assistant

## 2024-07-04 DIAGNOSIS — Z1231 Encounter for screening mammogram for malignant neoplasm of breast: Secondary | ICD-10-CM

## 2024-08-11 NOTE — Progress Notes (Deleted)
 Claire Console, PA-C [address] [place], [place]  [address] Phone: [phone]   Primary Care Physician: Donata Snowman, PA-C  Primary Gastroenterologist:  Claire Console, PA-C / Norleen Kiang, MD   Chief Complaint:  F/U Dysphagia and GERD       HPI:   Discussed the use of AI scribe software for clinical note transcription with the patient, who gave verbal consent to proceed.  58 year old female, established patient Dr. Kiang, returns for follow-up of GERD, dysphagia, and esophageal stricture.  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  She is currently taking omeprazole  20 mg daily for GERD.  02/2024 EGD by Dr. Kiang: Large caliber distal esophageal stricture measuring 15 mm.  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  Esophageal dilator to 20 mm with balloon dilator.  Evidence of sleeve gastrectomy, otherwise normal.  History of Present Illness   06/2018 colonoscopy was normal.  10-year repeat.  Current Outpatient Medications  Medication Sig Dispense Refill   amLODipine (NORVASC) 5 MG tablet Take 5 mg by mouth daily.     atorvastatin (LIPITOR) 20 MG tablet Take 1 tablet by mouth once daily     baclofen (LIORESAL) 10 MG tablet Take 10 mg by mouth 3 (three) times daily. (Patient not taking: Reported on 03/04/2024)     Cholecalciferol 50 MCG (2000 UT) CAPS Take 2,000 Units by mouth.     cycloSPORINE (RESTASIS) 0.05 % ophthalmic emulsion Place 1 drop into both eyes.     diclofenac  Sodium (VOLTAREN) 1 % GEL Apply 2-4 g topically 4 (four) times daily. (Patient not taking: Reported on 03/04/2024)     DULERA 100-5 MCG/ACT AERO Inhale 2 puffs into the lungs 2 (two) times daily as needed.     gabapentin  (NEURONTIN ) 400 MG capsule Take 400 mg by mouth every evening.     hydrOXYzine (VISTARIL) 25 MG capsule Take by mouth. Take 1 in the morning as needed and 3 at bedtime     levothyroxine (SYNTHROID) 25 MCG tablet Take 25 mcg by mouth every morning.     omeprazole  (PRILOSEC) 20  MG capsule Take 20 mg by mouth daily.     ondansetron  (ZOFRAN -ODT) 8 MG disintegrating tablet Take 8 mg by mouth every 8 (eight) hours as needed.     sertraline  (ZOLOFT ) 100 MG tablet Take 150 mg by mouth daily.     traZODone (DESYREL) 50 MG tablet Take 50 mg by mouth at bedtime.     triamcinolone  ointment (KENALOG ) 0.5 % Apply to affected areas of the body twice daily until rash has resolved.  Do not apply this ointment to the face or to the genital area. (Patient not taking: Reported on 03/04/2024) 80 g 0   Current Facility-Administered Medications  Medication Dose Route Frequency Provider Last Rate Last Admin   0.9 %  sodium chloride  infusion  500 mL Intravenous Once Kiang Norleen SAILOR, MD       0.9 %  sodium chloride  infusion  500 mL Intravenous Once Kiang Norleen SAILOR, MD        Allergies as of 08/12/2024 - Review Complete 03/04/2024  Allergen Reaction Noted   Bactrim [sulfamethoxazole-trimethoprim] Hives 04/01/2015   Penicillins Rash 08/13/2011   Sulfa antibiotics Rash 04/13/2023    Past Medical History:  Diagnosis Date   Anemia    Anxiety    Apnea, sleep    Does not use CPAP every night but some nights   Arthritis    Arthritis, low back  Bronchitis    Depression    GERD (gastroesophageal reflux disease)    Hyperlipidemia    Hypertension    Morbid obesity (HCC)    Sleep apnea    On CPAP   Strep throat     Past Surgical History:  Procedure Laterality Date   ABDOMINAL HYSTERECTOMY Bilateral 06/16/2014   Procedure: SUPERCERVICAL HYSTERECTOMY ABDOMINAL, LEFT SALPINGO-OPPHERECTOMY, AND RIGHT SALPINGECTOMY;  Surgeon: Harland JAYSON Birkenhead, MD;  Location: [facility];  Service: Gynecology;  Laterality: Bilateral;  With bilateral salpingectomy   BREAST EXCISIONAL BIOPSY Bilateral 1997   BREAST SURGERY  1997   cyst removed bilat breast   c section  1982, 1988, 1989   x 3   LAPAROSCOPIC GASTRIC SLEEVE RESECTION  03/08/2023   TUBAL LIGATION  1989   Upper teeth removed     WISDOM TOOTH EXTRACTION       Review of Systems:    All systems reviewed and negative except where noted in HPI.    Physical Exam:  LMP 05/18/2014  Patient's last menstrual period was 05/18/2014.  General: Well-nourished, well-developed in no acute distress.  Lungs: Clear to auscultation bilaterally. Non-labored. Heart: Regular rate and rhythm, no murmurs rubs or gallops.  Abdomen: Bowel sounds are normal; Abdomen is Soft; No hepatosplenomegaly, masses or hernias;  No Abdominal Tenderness; No guarding or rebound tenderness. Neuro: Alert and oriented x 3.  Grossly intact.  Psych: Alert and cooperative, normal mood and affect.   Imaging Studies: No results found.  Labs: CBC    Component Value Date/Time   WBC 14.2 (H) 06/17/2014 0539   RBC 3.78 (L) 06/17/2014 0539   HGB 8.5 (L) 06/17/2014 0539   HCT 27.2 (L) 06/17/2014 0539   PLT 318 06/17/2014 0539   MCV 72.0 (L) 06/17/2014 0539   MCH 22.5 (L) 06/17/2014 0539   MCHC 31.3 06/17/2014 0539   RDW 18.8 (H) 06/17/2014 0539    CMP     Component Value Date/Time   NA 137 06/12/2014 1143   K 3.3 (L) 06/12/2014 1143   CL 97 06/12/2014 1143   CO2 31 06/12/2014 1143   GLUCOSE 108 (H) 06/12/2014 1143   BUN 10 06/12/2014 1143   CREATININE 0.84 06/12/2014 1143   CALCIUM 8.7 06/12/2014 1143   PROT 8.2 08/13/2011 0759   ALBUMIN 3.7 08/13/2011 0759   AST 16 08/13/2011 0759   ALT 9 08/13/2011 0759   ALKPHOS 72 08/13/2011 0759   BILITOT 0.3 08/13/2011 0759   GFRNONAA 81 (L) 06/12/2014 1143   GFRAA >90 06/12/2014 1143       Assessment and Plan:   Claire Green is a 58 y.o. y/o female ***  Assessment and Plan Assessment & Plan       Claire Console, PA-C  Follow up ***

## 2024-08-12 ENCOUNTER — Ambulatory Visit: Admitting: Physician Assistant

## 2024-09-14 NOTE — Progress Notes (Unsigned)
 "     Claire Console, PA-C [address] [place], [place]  [address] Phone: [phone]   Primary Care Physician: Donata Snowman, PA-C  Primary Gastroenterologist:  Claire Console, PA-C / Norleen Kiang, MD   Chief Complaint: Follow-up dysphagia and GERD       HPI:   Discussed the use of AI scribe software for clinical note transcription with the patient, who gave verbal consent to proceed.  Patient last saw Dr. Kiang 01/2024 for follow-up of GERD, globus sensation, with history of peptic stricture.  Had sleeve gastrectomy 02/2023.  Had 60 pound weight loss since sleeve gastrectomy.  Currently takes omeprazole  20 mg daily for GERD.  02/2024 EGD by Dr. Kiang: Distal esophageal stricture measuring 15 mm.  Question partial web in the proximal esophagus.  Esophagus dilated to 28 mm.  Stomach showed evidence of prior sleeve gastrectomy, otherwise normal.  08/2020 EGD: Esophageal stricture dilated to 20 mm.  H. pylori negative.  06/2018 colonoscopy:  Normal.  10-year repeat screening due 06/2028.  History of Present Illness      Current Outpatient Medications  Medication Sig Dispense Refill   amLODipine (NORVASC) 5 MG tablet Take 5 mg by mouth daily.     atorvastatin (LIPITOR) 20 MG tablet Take 1 tablet by mouth once daily     baclofen (LIORESAL) 10 MG tablet Take 10 mg by mouth 3 (three) times daily. (Patient not taking: Reported on 03/04/2024)     Cholecalciferol 50 MCG (2000 UT) CAPS Take 2,000 Units by mouth.     cycloSPORINE (RESTASIS) 0.05 % ophthalmic emulsion Place 1 drop into both eyes.     diclofenac  Sodium (VOLTAREN) 1 % GEL Apply 2-4 g topically 4 (four) times daily. (Patient not taking: Reported on 03/04/2024)     DULERA 100-5 MCG/ACT AERO Inhale 2 puffs into the lungs 2 (two) times daily as needed.     gabapentin  (NEURONTIN ) 400 MG capsule Take 400 mg by mouth every evening.     hydrOXYzine (VISTARIL) 25 MG capsule Take by mouth. Take 1 in the morning as needed and 3 at  bedtime     levothyroxine (SYNTHROID) 25 MCG tablet Take 25 mcg by mouth every morning.     omeprazole  (PRILOSEC) 20 MG capsule Take 20 mg by mouth daily.     ondansetron  (ZOFRAN -ODT) 8 MG disintegrating tablet Take 8 mg by mouth every 8 (eight) hours as needed.     sertraline  (ZOLOFT ) 100 MG tablet Take 150 mg by mouth daily.     traZODone (DESYREL) 50 MG tablet Take 50 mg by mouth at bedtime.     triamcinolone  ointment (KENALOG ) 0.5 % Apply to affected areas of the body twice daily until rash has resolved.  Do not apply this ointment to the face or to the genital area. (Patient not taking: Reported on 03/04/2024) 80 g 0   Current Facility-Administered Medications  Medication Dose Route Frequency Provider Last Rate Last Admin   0.9 %  sodium chloride  infusion  500 mL Intravenous Once Kiang Norleen SAILOR, MD       0.9 %  sodium chloride  infusion  500 mL Intravenous Once Kiang Norleen SAILOR, MD        Allergies as of 09/18/2024 - Review Complete 03/04/2024  Allergen Reaction Noted   Bactrim [sulfamethoxazole-trimethoprim] Hives 04/01/2015   Penicillins Rash 08/13/2011   Sulfa antibiotics Rash 04/13/2023    Past Medical History:  Diagnosis Date   Anemia    Anxiety    Apnea, sleep  Does not use CPAP every night but some nights   Arthritis    Arthritis, low back    Bronchitis    Depression    GERD (gastroesophageal reflux disease)    Hyperlipidemia    Hypertension    Morbid obesity (HCC)    Sleep apnea    On CPAP   Strep throat     Past Surgical History:  Procedure Laterality Date   ABDOMINAL HYSTERECTOMY Bilateral 06/16/2014   Procedure: SUPERCERVICAL HYSTERECTOMY ABDOMINAL, LEFT SALPINGO-OPPHERECTOMY, AND RIGHT SALPINGECTOMY;  Surgeon: Harland JAYSON Birkenhead, MD;  Location: [facility];  Service: Gynecology;  Laterality: Bilateral;  With bilateral salpingectomy   BREAST EXCISIONAL BIOPSY Bilateral 1997   BREAST SURGERY  1997   cyst removed bilat breast   c section  1982, 1988, 1989   x 3    LAPAROSCOPIC GASTRIC SLEEVE RESECTION  03/08/2023   TUBAL LIGATION  1989   Upper teeth removed     WISDOM TOOTH EXTRACTION      Review of Systems:    All systems reviewed and negative except where noted in HPI.    Physical Exam:  LMP 05/18/2014  Patient's last menstrual period was 05/18/2014.  General: Well-nourished, well-developed in no acute distress.  Lungs: Clear to auscultation bilaterally. Non-labored. Heart: Regular rate and rhythm, no murmurs rubs or gallops.  Abdomen: Bowel sounds are normal; Abdomen is Soft; No hepatosplenomegaly, masses or hernias;  No Abdominal Tenderness; No guarding or rebound tenderness. Neuro: Alert and oriented x 3.  Grossly intact.  Psych: Alert and cooperative, normal mood and affect.   Imaging Studies: No results found.  Labs: CBC    Component Value Date/Time   WBC 14.2 (H) 06/17/2014 0539   RBC 3.78 (L) 06/17/2014 0539   HGB 8.5 (L) 06/17/2014 0539   HCT 27.2 (L) 06/17/2014 0539   PLT 318 06/17/2014 0539   MCV 72.0 (L) 06/17/2014 0539   MCH 22.5 (L) 06/17/2014 0539   MCHC 31.3 06/17/2014 0539   RDW 18.8 (H) 06/17/2014 0539    CMP     Component Value Date/Time   NA 137 06/12/2014 1143   K 3.3 (L) 06/12/2014 1143   CL 97 06/12/2014 1143   CO2 31 06/12/2014 1143   GLUCOSE 108 (H) 06/12/2014 1143   BUN 10 06/12/2014 1143   CREATININE 0.84 06/12/2014 1143   CALCIUM 8.7 06/12/2014 1143   PROT 8.2 08/13/2011 0759   ALBUMIN 3.7 08/13/2011 0759   AST 16 08/13/2011 0759   ALT 9 08/13/2011 0759   ALKPHOS 72 08/13/2011 0759   BILITOT 0.3 08/13/2011 0759   GFRNONAA 81 (L) 06/12/2014 1143   GFRAA >90 06/12/2014 1143       Assessment and Plan:   Claire Green is a 59 y.o. y/o female returns for follow-up of:  1.  GERD  2.  With peptic stricture dilated to 20 mm during EGD 02/2024.  3.  Colon cancer screening - 10-year repeat screening colonoscopy will be due 06/2028.  4.  History of sleeve gastrectomy with intentional  weight loss.  Assessment and Plan Assessment & Plan       Claire Console, PA-C  Follow up ***   "

## 2024-09-18 ENCOUNTER — Ambulatory Visit: Admitting: Physician Assistant

## 2025-01-27 ENCOUNTER — Ambulatory Visit: Admitting: Physician Assistant
